# Patient Record
Sex: Female | Born: 1965 | Race: White | Hispanic: No | Smoking: Former smoker
Health system: Southern US, Community
[De-identification: ages and names within clinical notes are randomized; demographics above are authoritative.]

## PROBLEM LIST (undated history)

## (undated) ENCOUNTER — Ambulatory Visit: Admission: EM | Payer: Medicaid Other | Source: Home / Self Care

## (undated) DIAGNOSIS — E785 Hyperlipidemia, unspecified: Secondary | ICD-10-CM

## (undated) DIAGNOSIS — I10 Essential (primary) hypertension: Secondary | ICD-10-CM

## (undated) DIAGNOSIS — T7840XA Allergy, unspecified, initial encounter: Secondary | ICD-10-CM

## (undated) HISTORY — DX: Allergy, unspecified, initial encounter: T78.40XA

## (undated) HISTORY — DX: Essential (primary) hypertension: I10

## (undated) HISTORY — DX: Hyperlipidemia, unspecified: E78.5

---

## 1996-03-27 DIAGNOSIS — Z87891 Personal history of nicotine dependence: Secondary | ICD-10-CM | POA: Insufficient documentation

## 1996-03-27 DIAGNOSIS — J309 Allergic rhinitis, unspecified: Secondary | ICD-10-CM | POA: Insufficient documentation

## 1997-12-02 DIAGNOSIS — Z8669 Personal history of other diseases of the nervous system and sense organs: Secondary | ICD-10-CM | POA: Insufficient documentation

## 1998-03-07 HISTORY — PX: VAGINAL HYSTERECTOMY: SUR661

## 2003-10-25 ENCOUNTER — Ambulatory Visit: Payer: Self-pay | Admitting: Specialist

## 2005-07-10 ENCOUNTER — Ambulatory Visit: Payer: Self-pay

## 2007-07-08 DIAGNOSIS — E7800 Pure hypercholesterolemia, unspecified: Secondary | ICD-10-CM | POA: Insufficient documentation

## 2007-07-08 DIAGNOSIS — L989 Disorder of the skin and subcutaneous tissue, unspecified: Secondary | ICD-10-CM | POA: Insufficient documentation

## 2007-07-10 ENCOUNTER — Ambulatory Visit: Payer: Self-pay

## 2007-08-11 ENCOUNTER — Ambulatory Visit: Payer: Self-pay | Admitting: Gastroenterology

## 2007-08-11 LAB — HM COLONOSCOPY

## 2008-01-27 DIAGNOSIS — M779 Enthesopathy, unspecified: Secondary | ICD-10-CM | POA: Insufficient documentation

## 2009-03-15 ENCOUNTER — Ambulatory Visit: Payer: Self-pay

## 2009-06-21 DIAGNOSIS — M791 Myalgia, unspecified site: Secondary | ICD-10-CM | POA: Insufficient documentation

## 2011-04-09 ENCOUNTER — Ambulatory Visit: Payer: Self-pay | Admitting: Family Medicine

## 2012-03-28 ENCOUNTER — Emergency Department: Payer: Self-pay | Admitting: Emergency Medicine

## 2012-03-28 HISTORY — PX: FRACTURE SURGERY: SHX138

## 2013-02-24 ENCOUNTER — Ambulatory Visit: Payer: Self-pay | Admitting: Family Medicine

## 2014-03-13 ENCOUNTER — Emergency Department: Payer: Self-pay | Admitting: Emergency Medicine

## 2014-04-13 ENCOUNTER — Ambulatory Visit: Payer: Self-pay | Admitting: Family Medicine

## 2014-04-13 LAB — HM MAMMOGRAPHY

## 2014-08-03 ENCOUNTER — Other Ambulatory Visit: Payer: Self-pay

## 2014-08-03 DIAGNOSIS — G4700 Insomnia, unspecified: Secondary | ICD-10-CM

## 2014-08-03 MED ORDER — QUETIAPINE FUMARATE 25 MG PO TABS: 25.0000 mg | ORAL_TABLET | Freq: Every day | ORAL | Status: DC

## 2014-08-23 HISTORY — PX: KNEE ARTHROSCOPY: SUR90

## 2014-10-13 ENCOUNTER — Other Ambulatory Visit: Payer: Self-pay | Admitting: Family Medicine

## 2014-10-13 DIAGNOSIS — E7800 Pure hypercholesterolemia, unspecified: Secondary | ICD-10-CM

## 2014-10-13 NOTE — Telephone Encounter (Signed)
Last OV 03/2014  Thanks,   -Laura  

## 2014-11-15 ENCOUNTER — Other Ambulatory Visit: Payer: Self-pay | Admitting: Family Medicine

## 2014-11-15 DIAGNOSIS — G43919 Migraine, unspecified, intractable, without status migrainosus: Secondary | ICD-10-CM

## 2014-11-15 NOTE — Telephone Encounter (Signed)
LOV 04/06/2014. Allene DillonEmily Drozdowski, CMA

## 2015-01-14 ENCOUNTER — Other Ambulatory Visit: Payer: Self-pay

## 2015-01-14 DIAGNOSIS — I10 Essential (primary) hypertension: Secondary | ICD-10-CM | POA: Insufficient documentation

## 2015-01-14 MED ORDER — LISINOPRIL 10 MG PO TABS: 10.0000 mg | ORAL_TABLET | Freq: Every day | ORAL | Status: DC

## 2015-02-04 DIAGNOSIS — R7303 Prediabetes: Secondary | ICD-10-CM | POA: Insufficient documentation

## 2015-02-04 DIAGNOSIS — R42 Dizziness and giddiness: Secondary | ICD-10-CM | POA: Insufficient documentation

## 2015-02-04 DIAGNOSIS — L309 Dermatitis, unspecified: Secondary | ICD-10-CM | POA: Insufficient documentation

## 2015-02-04 DIAGNOSIS — E559 Vitamin D deficiency, unspecified: Secondary | ICD-10-CM | POA: Insufficient documentation

## 2015-02-04 DIAGNOSIS — G4700 Insomnia, unspecified: Secondary | ICD-10-CM | POA: Insufficient documentation

## 2015-02-07 ENCOUNTER — Encounter: Payer: Self-pay | Admitting: Family Medicine

## 2015-02-07 ENCOUNTER — Ambulatory Visit (INDEPENDENT_AMBULATORY_CARE_PROVIDER_SITE_OTHER): Payer: BLUE CROSS/BLUE SHIELD | Admitting: Family Medicine

## 2015-02-07 VITALS — BP 102/64 | HR 98 | Temp 98.4°F | Resp 16 | Ht 63.5 in | Wt 177.0 lb

## 2015-02-07 DIAGNOSIS — G4700 Insomnia, unspecified: Secondary | ICD-10-CM

## 2015-02-07 DIAGNOSIS — E781 Pure hyperglyceridemia: Secondary | ICD-10-CM | POA: Diagnosis not present

## 2015-02-07 DIAGNOSIS — Z23 Encounter for immunization: Secondary | ICD-10-CM | POA: Diagnosis not present

## 2015-02-07 DIAGNOSIS — E559 Vitamin D deficiency, unspecified: Secondary | ICD-10-CM | POA: Diagnosis not present

## 2015-02-07 DIAGNOSIS — E7800 Pure hypercholesterolemia, unspecified: Secondary | ICD-10-CM

## 2015-02-07 DIAGNOSIS — J309 Allergic rhinitis, unspecified: Secondary | ICD-10-CM

## 2015-02-07 DIAGNOSIS — G43919 Migraine, unspecified, intractable, without status migrainosus: Secondary | ICD-10-CM | POA: Diagnosis not present

## 2015-02-07 DIAGNOSIS — I10 Essential (primary) hypertension: Secondary | ICD-10-CM | POA: Diagnosis not present

## 2015-02-07 MED ORDER — LEVETIRACETAM 750 MG PO TABS: 750.0000 mg | ORAL_TABLET | Freq: Every day | ORAL | Status: DC

## 2015-02-07 MED ORDER — PROPRANOLOL HCL 40 MG PO TABS: 40.0000 mg | ORAL_TABLET | Freq: Two times a day (BID) | ORAL | Status: DC

## 2015-02-07 MED ORDER — FLUTICASONE PROPIONATE 50 MCG/ACT NA SUSP: 2.0000 | Freq: Every day | NASAL | Status: DC

## 2015-02-07 MED ORDER — PRAVASTATIN SODIUM 40 MG PO TABS: 40.0000 mg | ORAL_TABLET | Freq: Every day | ORAL | Status: DC

## 2015-02-07 MED ORDER — QUETIAPINE FUMARATE 25 MG PO TABS: 25.0000 mg | ORAL_TABLET | Freq: Every day | ORAL | Status: DC

## 2015-02-07 MED ORDER — LISINOPRIL 10 MG PO TABS: 10.0000 mg | ORAL_TABLET | Freq: Every day | ORAL | Status: DC

## 2015-02-07 NOTE — Progress Notes (Signed)
Patient ID: Angela Woodward, female   DOB: 02/08/1965, 50 y.o.   MRN: [id number]         [name] Female    DOB: 12/25/1965   50 y.o.   MRN: [id number] Visit Date: 02/07/2015  Today's Provider: Lorie PhenixNancy Tinika Bucknam, MD   Chief Complaint  Patient presents with  . Hypertension  . Hyperlipidemia  . Migraine   Subjective:    Hypertension This is a chronic problem. The problem is controlled (Pt reports her blood pressure has been running low recently.  This morning it was 97/63). Associated symptoms include malaise/fatigue. Pertinent negatives include no anxiety, blurred vision, chest pain, headaches, neck pain, orthopnea, palpitations, peripheral edema, PND, shortness of breath or sweats. Risk factors for coronary artery disease include dyslipidemia. There are no compliance problems.   Hyperlipidemia This is a chronic problem. The problem is controlled. Recent lipid tests were reviewed and are normal. Pertinent negatives include no chest pain or shortness of breath. Current antihyperlipidemic treatment includes statins. There are no compliance problems.   Migraine  This is a chronic problem. The problem has been resolved (Under good control with Keppra.  Pt reports not having a mirgrain in several years.). The patient is experiencing no pain. Associated symptoms include insomnia. Pertinent negatives include no blurred vision, dizziness, fever, neck pain or weakness. The treatment provided significant relief. Her past medical history is significant for hypertension.  Insomnia Primary symptoms: malaise/fatigue.  The problem has been resolved since onset. How many beverages per day that contain caffeine: 2-3.  Typical bedtime:  8-10 P.M..  How long after going to bed to you fall asleep: less than 15 minutes.          Allergies  Allergen Reactions  . Maxalt  [Rizatriptan Benzoate] Swelling    Throat swelling.  . Cephalosporins   . Codeine   . Erythromycin   . Ketorolac  Tromethamine   . Penicillins   . Simvastatin Swelling    Other reaction(s): Muscle Pain  . Sulfa Antibiotics   . Tetanus Toxoid     Other reaction(s): Swelling   Previous Medications   FLUTICASONE (FLONASE) 50 MCG/ACT NASAL SPRAY    Place 2 sprays into the nose daily.   LEVETIRACETAM (KEPPRA) 750 MG TABLET    TAKE ONE TABLET BY MOUTH AT BEDTIME   LISINOPRIL (PRINIVIL,ZESTRIL) 10 MG TABLET    Take 1 tablet (10 mg total) by mouth daily. Pt needs to schedule office visit before anymore refills.   LORATADINE (CLARITIN) 10 MG TABLET    Take 1 tablet by mouth daily.   MONTELUKAST (SINGULAIR) 10 MG TABLET    Take 1 tablet by mouth daily.   PRAVASTATIN (PRAVACHOL) 40 MG TABLET    TAKE ONE TABLET BY MOUTH AT BEDTIME   PROPRANOLOL (INDERAL) 80 MG TABLET    Take 1 tablet by mouth 2 (two) times daily.   QUETIAPINE (SEROQUEL) 25 MG TABLET    Take 1 tablet (25 mg total) by mouth at bedtime.    Review of Systems  Constitutional: Positive for malaise/fatigue and fatigue. Negative for fever, chills, diaphoresis, activity change, appetite change and unexpected weight change.  Eyes: Negative for blurred vision.  Respiratory: Negative.  Negative for shortness of breath.   Cardiovascular: Negative.  Negative for chest pain, palpitations, orthopnea and PND.  Gastrointestinal: Negative.   Musculoskeletal: Negative.  Negative for neck pain.  Neurological: Positive for light-headedness. Negative for dizziness, weakness and headaches.  Psychiatric/Behavioral: The patient has insomnia.  Social History  Substance Use Topics  . Smoking status: Former Smoker    Quit date: 09/09/2009  . Smokeless tobacco: Never Used  . Alcohol Use: No   Objective:   BP 102/64 mmHg  Pulse 98  Temp(Src) 98.4 F (36.9 C) (Oral)  Resp 16  Ht 5' 3.5" (1.613 m)  Wt 177 lb (80.287 kg)  BMI 30.86 kg/m2  Physical Exam  Constitutional: She is oriented to person, place, and time. She appears well-developed and  well-nourished.  Cardiovascular: Normal rate, regular rhythm and normal heart sounds.   Pulmonary/Chest: Effort normal and breath sounds normal.  Neurological: She is alert and oriented to person, place, and time.  Psychiatric: She has a normal mood and affect. Her behavior is normal. Judgment and thought content normal.        Assessment & Plan:      1. Essential hypertension Blood pressure low today, She states it has also been running low at home.  Will decrease Inderal to 40mg  twice a day.  Advised pt to call with blood pressure reading, may need to reduce meds more.  Advised her to call if her Migraines return.   - lisinopril (PRINIVIL,ZESTRIL) 10 MG tablet; Take 1 tablet (10 mg total) by mouth daily. Pt needs to schedule office visit before anymore refills.  Dispense: 90 tablet; Refill: 1 - propranolol (INDERAL) 40 MG tablet; Take 1 tablet (40 mg total) by mouth 2 (two) times daily.  Dispense: 180 tablet; Refill: 1 - Comprehensive metabolic panel - Lipid panel - TSH  2. Intractable migraine without status migrainosus, unspecified migraine type Under good control, continue Keppra.  Refills provided.  - levETIRAcetam (KEPPRA) 750 MG tablet; Take 1 tablet (750 mg total) by mouth at bedtime.  Dispense: 90 tablet; Refill: 1  3. Hypercholesteremia Stable, refills provided. Check labs.  - pravastatin (PRAVACHOL) 40 MG tablet; Take 1 tablet (40 mg total) by mouth at bedtime.  Dispense: 90 tablet; Refill: 1 - Lipid panel  4. Insomnia Stable, Refills provided.  - QUEtiapine (SEROQUEL) 25 MG tablet; Take 1 tablet (25 mg total) by mouth at bedtime.  Dispense: 90 tablet; Refill: 1  5. Allergic rhinitis, unspecified allergic rhinitis type Stable, Refills provided.  - fluticasone (FLONASE) 50 MCG/ACT nasal spray; Place 2 sprays into both nostrils daily.  Dispense: 16 g; Refill: 5 - CBC with Differential/Platelet  6. Avitaminosis D Has been a problem in the past, will recheck.  -  VITAMIN D 25 Hydroxy (Vit-D Deficiency, Fractures)  7. Hyperglyceridemia, pure Will check labs. - Hemoglobin A1c  8. Flu vaccine need Given in the office today. - Flu Vaccine QUAD 36+ mos PF IM (Fluarix & Fluzone Quad PF)       Lorie Phenix, MD  [facility]

## 2015-02-09 ENCOUNTER — Telehealth: Payer: Self-pay

## 2015-02-09 LAB — LIPID PANEL
Chol/HDL Ratio: 3.2 ratio units (ref 0.0–4.4)
Cholesterol, Total: 146 mg/dL (ref 100–199)
HDL: 46 mg/dL (ref >39)
LDL Calculated: 73 mg/dL (ref 0–99)
Triglycerides: 134 mg/dL (ref 0–149)
VLDL Cholesterol Cal: 27 mg/dL (ref 5–40)

## 2015-02-09 LAB — CBC WITH DIFFERENTIAL/PLATELET
Basophils Absolute: 0 10*3/uL (ref 0.0–0.2)
Basos: 0 %
EOS (ABSOLUTE): 0.4 10*3/uL (ref 0.0–0.4)
Eos: 6 %
Hematocrit: 40.8 % (ref 34.0–46.6)
Hemoglobin: 13.8 g/dL (ref 11.1–15.9)
Immature Grans (Abs): 0 10*3/uL (ref 0.0–0.1)
Immature Granulocytes: 0 %
Lymphocytes Absolute: 1.4 10*3/uL (ref 0.7–3.1)
Lymphs: 22 %
MCH: 30.5 pg (ref 26.6–33.0)
MCHC: 33.8 g/dL (ref 31.5–35.7)
MCV: 90 fL (ref 79–97)
Monocytes Absolute: 0.6 10*3/uL (ref 0.1–0.9)
Monocytes: 9 %
Neutrophils Absolute: 4.1 10*3/uL (ref 1.4–7.0)
Neutrophils: 63 %
Platelets: 306 10*3/uL (ref 150–379)
RBC: 4.52 x10E6/uL (ref 3.77–5.28)
RDW: 13.8 % (ref 12.3–15.4)
WBC: 6.5 10*3/uL (ref 3.4–10.8)

## 2015-02-09 LAB — COMPREHENSIVE METABOLIC PANEL
ALT: 16 IU/L (ref 0–32)
AST: 17 IU/L (ref 0–40)
Albumin/Globulin Ratio: 2.1 (ref 1.1–2.5)
Albumin: 4.5 g/dL (ref 3.5–5.5)
Alkaline Phosphatase: 89 IU/L (ref 39–117)
BUN/Creatinine Ratio: 13 (ref 9–23)
BUN: 10 mg/dL (ref 6–24)
Bilirubin Total: 0.6 mg/dL (ref 0.0–1.2)
CO2: 25 mmol/L (ref 18–29)
Calcium: 9.4 mg/dL (ref 8.7–10.2)
Chloride: 99 mmol/L (ref 96–106)
Creatinine, Ser: 0.77 mg/dL (ref 0.57–1.00)
GFR calc Af Amer: 104 mL/min/{1.73_m2} (ref >59)
GFR calc non Af Amer: 90 mL/min/{1.73_m2} (ref >59)
Globulin, Total: 2.1 g/dL (ref 1.5–4.5)
Glucose: 101 mg/dL — ABNORMAL HIGH (ref 65–99)
Potassium: 4.6 mmol/L (ref 3.5–5.2)
Sodium: 138 mmol/L (ref 134–144)
Total Protein: 6.6 g/dL (ref 6.0–8.5)

## 2015-02-09 LAB — HEMOGLOBIN A1C
Est. average glucose Bld gHb Est-mCnc: 111 mg/dL
Hgb A1c MFr Bld: 5.5 % (ref 4.8–5.6)

## 2015-02-09 LAB — TSH: TSH: 1.58 u[IU]/mL (ref 0.450–4.500)

## 2015-02-09 LAB — VITAMIN D 25 HYDROXY (VIT D DEFICIENCY, FRACTURES): Vit D, 25-Hydroxy: 16.1 ng/mL — ABNORMAL LOW (ref 30.0–100.0)

## 2015-02-09 NOTE — Telephone Encounter (Signed)
LMTCB 02/09/2015  Thanks,   -Laura 

## 2015-02-09 NOTE — Telephone Encounter (Signed)
-----   Message from Lorie Phenix, MD sent at 02/09/2015  9:54 AM EST ----- Labs stable except for low Vit D. Start vit D 2000 iu daily. Thanks.

## 2015-02-10 NOTE — Telephone Encounter (Signed)
Pt advised.   Thanks,   -Laura  

## 2015-07-28 ENCOUNTER — Other Ambulatory Visit: Payer: Self-pay | Admitting: Family Medicine

## 2015-07-28 NOTE — Telephone Encounter (Signed)
LOV  02/07/2015

## 2015-09-22 ENCOUNTER — Other Ambulatory Visit: Payer: Self-pay | Admitting: Family Medicine

## 2015-09-22 DIAGNOSIS — I10 Essential (primary) hypertension: Secondary | ICD-10-CM

## 2015-10-23 ENCOUNTER — Other Ambulatory Visit: Payer: Self-pay | Admitting: Family Medicine

## 2015-10-24 ENCOUNTER — Encounter: Payer: Self-pay | Admitting: Family Medicine

## 2015-10-24 ENCOUNTER — Ambulatory Visit (INDEPENDENT_AMBULATORY_CARE_PROVIDER_SITE_OTHER): Payer: BLUE CROSS/BLUE SHIELD | Admitting: Family Medicine

## 2015-10-24 VITALS — BP 122/70 | HR 66 | Temp 97.8°F | Resp 16 | Wt 172.0 lb

## 2015-10-24 DIAGNOSIS — M546 Pain in thoracic spine: Secondary | ICD-10-CM | POA: Diagnosis not present

## 2015-10-24 LAB — POCT URINALYSIS DIPSTICK
Bilirubin, UA: NEGATIVE
Blood, UA: NEGATIVE
Glucose, UA: NEGATIVE
Ketones, UA: NEGATIVE
Leukocytes, UA: NEGATIVE
Nitrite, UA: NEGATIVE
Protein, UA: NEGATIVE
Spec Grav, UA: 1.01
Urobilinogen, UA: 0.2
pH, UA: 6

## 2015-10-24 MED ORDER — CYCLOBENZAPRINE HCL 5 MG PO TABS: 5.0000 mg | ORAL_TABLET | Freq: Three times a day (TID) | ORAL | 1 refills | Status: DC | PRN

## 2015-10-24 NOTE — Patient Instructions (Addendum)
   You can 2 ibuprofen every eight hours until pain has gone   Call office if you develop any rashes or new skin lesions on your back

## 2015-10-24 NOTE — Progress Notes (Signed)
[name] Female    DOB: 08/17/1965   50 y.o.   MRN: [id number] Visit Date: 10/24/2015  Today's Provider: Mila Merryonald Fisher, MD   Chief Complaint  Patient presents with  . Back Pain   Subjective:    Back Pain  This is a new problem. Episode onset: 2-3 days ago. The problem occurs intermittently. The problem has been gradually worsening since onset. The quality of the pain is described as cramping (catching pain). Radiates to: left flank into the left inguinal area. The symptoms are aggravated by twisting. Associated symptoms include abdominal pain. Pertinent negatives include no bladder incontinence, bowel incontinence, chest pain, dysuria, fever, headaches, leg pain, numbness, paresis, paresthesias, pelvic pain, perianal numbness, tingling, weakness or weight loss. She has tried ice and NSAIDs for the symptoms. The treatment provided mild relief.       Allergies  Allergen Reactions  . Maxalt  [Rizatriptan Benzoate] Swelling    Throat swelling.  . Cephalosporins   . Codeine   . Erythromycin   . Ketorolac Tromethamine   . Penicillins   . Simvastatin Swelling    Other reaction(s): Muscle Pain  . Sulfa Antibiotics   . Tetanus Toxoid     Other reaction(s): Swelling     Current Outpatient Prescriptions:  .  levETIRAcetam (KEPPRA) 750 MG tablet, TAKE 1 TABLET BY MOUTH EVERY NIGHT AT BEDTIME, Disp: 90 tablet, Rfl: 0 .  lisinopril (PRINIVIL,ZESTRIL) 10 MG tablet, TAKE 1 TABLET BY MOUTH EVERY DAY, Disp: 90 tablet, Rfl: 3 .  pravastatin (PRAVACHOL) 40 MG tablet, TAKE 1 TABLET BY MOUTH EVERY NIGHT AT BEDTIME, Disp: 90 tablet, Rfl: 0 .  propranolol (INDERAL) 40 MG tablet, TAKE 1 TABLET BY MOUTH TWICE DAILY, Disp: 180 tablet, Rfl: 0 .  QUEtiapine (SEROQUEL) 25 MG tablet, TAKE 1 TABLET BY MOUTH EVERY NIGHT AT BEDTIME, Disp: 90 tablet, Rfl: 0 .  fluticasone (FLONASE) 50 MCG/ACT nasal spray, Place 2 sprays into both nostrils daily. (Patient not taking: Reported on  10/24/2015), Disp: 16 g, Rfl: 5 .  loratadine (CLARITIN) 10 MG tablet, Take 1 tablet by mouth daily., Disp: , Rfl:  .  montelukast (SINGULAIR) 10 MG tablet, Take 1 tablet by mouth daily., Disp: , Rfl:   Review of Systems  Constitutional: Negative for appetite change, chills, fatigue, fever and weight loss.  Respiratory: Negative for chest tightness and shortness of breath.   Cardiovascular: Negative for chest pain and palpitations.  Gastrointestinal: Positive for abdominal pain. Negative for bowel incontinence, nausea and vomiting.  Genitourinary: Negative for bladder incontinence, dysuria and pelvic pain.  Musculoskeletal: Positive for back pain and myalgias (muscle spasms in her back).  Neurological: Negative for dizziness, tingling, weakness, numbness, headaches and paresthesias.    Social History  Substance Use Topics  . Smoking status: Former Smoker    Quit date: 09/09/2009  . Smokeless tobacco: Never Used  . Alcohol use No   Objective:   BP 122/70 (BP Location: Right Arm, Patient Position: Sitting, Cuff Size: Normal)   Pulse 66   Temp 97.8 F (36.6 C) (Oral)   Resp 16   Wt 172 lb (78 kg)   BMI 29.99 kg/m   Physical Exam   General Appearance:    Alert, cooperative, no distress  Eyes:    PERRL, conjunctiva/corneas clear, EOM's intact       MS:   No spine tenderness. Point tenderness left posterior intercostal muscles about 4cm left of midline. No gross deformities.  No CVA  tenderness.   Skin:   Small solitary slightly raised follicular lesion right flank just inferior to area of point tenderness. No surrounding erythema.        Assessment & Plan:     1. Acute left-sided thoracic back pain Exam c/w musculoskeletal source, although there is single slightly vesicular appearing lesion near site of pain. Discussed possibility of Zoster with patient. She is going to call if she develops any rashes or other skin lesions. For now will start on scheduled doses of ibuprofen 400mg   every eight hours and continue application ice two to three times daily.  - POCT Urinalysis Dipstick - cyclobenzaprine (FLEXERIL) 5 MG tablet; Take 1 tablet (5 mg total) by mouth 3 (three) times daily as needed for muscle spasms.  Dispense: 20 tablet; Refill: 1          Mila Merry, MD  [facility]

## 2015-10-31 ENCOUNTER — Ambulatory Visit: Payer: BLUE CROSS/BLUE SHIELD | Admitting: Family Medicine

## 2015-10-31 ENCOUNTER — Emergency Department
Admission: EM | Admit: 2015-10-31 | Discharge: 2015-10-31 | Disposition: A | Discharge status: Home / Self Care | Payer: BLUE CROSS/BLUE SHIELD | Attending: Emergency Medicine | Admitting: Emergency Medicine

## 2015-10-31 ENCOUNTER — Emergency Department: Payer: BLUE CROSS/BLUE SHIELD

## 2015-10-31 DIAGNOSIS — I10 Essential (primary) hypertension: Secondary | ICD-10-CM | POA: Diagnosis not present

## 2015-10-31 DIAGNOSIS — Z87891 Personal history of nicotine dependence: Secondary | ICD-10-CM | POA: Insufficient documentation

## 2015-10-31 DIAGNOSIS — K529 Noninfective gastroenteritis and colitis, unspecified: Secondary | ICD-10-CM | POA: Diagnosis not present

## 2015-10-31 DIAGNOSIS — R1032 Left lower quadrant pain: Secondary | ICD-10-CM | POA: Diagnosis present

## 2015-10-31 DIAGNOSIS — Z79899 Other long term (current) drug therapy: Secondary | ICD-10-CM | POA: Diagnosis not present

## 2015-10-31 LAB — COMPREHENSIVE METABOLIC PANEL WITH GFR
ALT: 22 U/L (ref 14–54)
AST: 24 U/L (ref 15–41)
Albumin: 4.3 g/dL (ref 3.5–5.0)
Alkaline Phosphatase: 86 U/L (ref 38–126)
Anion gap: 5 (ref 5–15)
BUN: 17 mg/dL (ref 6–20)
CO2: 27 mmol/L (ref 22–32)
Calcium: 8.8 mg/dL — ABNORMAL LOW (ref 8.9–10.3)
Chloride: 106 mmol/L (ref 101–111)
Creatinine, Ser: 0.88 mg/dL (ref 0.44–1.00)
GFR calc Af Amer: >60 mL/min
GFR calc non Af Amer: >60 mL/min
Glucose, Bld: 94 mg/dL (ref 65–99)
Potassium: 3.4 mmol/L — ABNORMAL LOW (ref 3.5–5.1)
Sodium: 138 mmol/L (ref 135–145)
Total Bilirubin: 0.7 mg/dL (ref 0.3–1.2)
Total Protein: 7.1 g/dL (ref 6.5–8.1)

## 2015-10-31 LAB — TYPE AND SCREEN
ABO/RH(D): O POS
Antibody Screen: NEGATIVE

## 2015-10-31 LAB — CBC
HCT: 44.3 % (ref 35.0–47.0)
Hemoglobin: 15 g/dL (ref 12.0–16.0)
MCH: 29.9 pg (ref 26.0–34.0)
MCHC: 33.8 g/dL (ref 32.0–36.0)
MCV: 88.3 fL (ref 80.0–100.0)
Platelets: 276 10*3/uL (ref 150–440)
RBC: 5.02 MIL/uL (ref 3.80–5.20)
RDW: 13.1 % (ref 11.5–14.5)
WBC: 8.9 10*3/uL (ref 3.6–11.0)

## 2015-10-31 MED ORDER — METRONIDAZOLE 500 MG PO TABS: 500.0000 mg | ORAL_TABLET | Freq: Three times a day (TID) | ORAL | 0 refills | Status: AC

## 2015-10-31 MED ORDER — IOPAMIDOL (ISOVUE-300) INJECTION 61%
100.0000 mL | Freq: Once | INTRAVENOUS | Status: AC | PRN
  Administered 2015-10-31: 100 mL via INTRAVENOUS

## 2015-10-31 MED ORDER — IOPAMIDOL (ISOVUE-300) INJECTION 61%
30.0000 mL | Freq: Once | INTRAVENOUS | Status: AC | PRN
  Administered 2015-10-31: 30 mL via ORAL

## 2015-10-31 MED ORDER — CIPROFLOXACIN HCL 500 MG PO TABS
500.0000 mg | ORAL_TABLET | Freq: Once | ORAL | Status: AC
  Administered 2015-10-31: 500 mg via ORAL
  Filled 2015-10-31: qty 1

## 2015-10-31 MED ORDER — CIPROFLOXACIN HCL 500 MG PO TABS: 500.0000 mg | ORAL_TABLET | Freq: Two times a day (BID) | ORAL | 0 refills | Status: DC

## 2015-10-31 MED ORDER — METRONIDAZOLE 500 MG PO TABS
500.0000 mg | ORAL_TABLET | Freq: Once | ORAL | Status: AC
  Administered 2015-10-31: 500 mg via ORAL
  Filled 2015-10-31: qty 1

## 2015-10-31 MED ORDER — TRAMADOL HCL 50 MG PO TABS: 50.0000 mg | ORAL_TABLET | Freq: Four times a day (QID) | ORAL | 0 refills | Status: DC | PRN

## 2015-10-31 NOTE — ED Triage Notes (Signed)
Bright red to red blood in stool, each time with BM. Lower abdominal pain. Cramping X 3 days. Pt alert and oriented X4, active, cooperative, pt in NAD. RR even and unlabored, color WNL.

## 2015-10-31 NOTE — Discharge Instructions (Signed)
Please take your antibiotics as prescribed for their entire course even if feeling better. Please follow-up with her primary care doctor in the next 1-2 days for recheck/reevaluation. Please take your pain medication as needed, as prescribed, you may also use ibuprofen or Tylenol as written on the box. Return to the emergency department for any significant increase in rectal bleeding, increase in pain, or any other symptom personally concerning to yourself.

## 2015-10-31 NOTE — ED Provider Notes (Addendum)
[facility] Emergency Department Provider Note  Time seen: 4:19 PM  I have reviewed the triage vital signs and the nursing notes.   HISTORY  Chief Complaint Rectal Bleeding    HPI Angela Woodward is a 50 y.o. female With a past medical history of hypertension and hyperlipidemia who presents the emergency department with rectal bleeding left lower quadrant abdominal discomfort. According to the patient for the past one week she has been having left lower quadrant abdominal pain. She has noticed blood in her stool over the past 3 days. States the pain is worse today so she came to the emergency department for evaluation.describes the pain as moderate, dull aching pain located in the left lower quadrant. Denies any fever. States she saw her primary care doctor for the same initially thought she might have urinary tract infection but the urinalysis was normal. Thought she might of pulled a muscle, but at that time she was not having rectal bleeding.  Past Medical History:  Diagnosis Date  . Allergy   . Hyperlipidemia   . Hypertension     Patient Active Problem List   Diagnosis Date Noted  . Dermatitis, eczematoid 02/04/2015  . Dizziness 02/04/2015  . Borderline diabetes 02/04/2015  . 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 Avitaminosis D 02/04/2015  . BP (high blood pressure) 01/14/2015  . Insomnia 08/03/2014  . Muscle ache 06/21/2009  . Enthesopathy 01/27/2008  . Dermatologic disease 07/08/2007  . Hypercholesterolemia without hypertriglyceridemia 07/08/2007  . Headache, migraine, intractable 12/02/1997  . Allergic rhinitis 03/27/1996  . 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 FRACTURE SURGERY  03/28/12   wrist 4 pins, 10 screws   . KNEE ARTHROSCOPY Left 08/2014   Dr. Thomasena Edisollins; [facility]  . VAGINAL HYSTERECTOMY  03/07/1998    Prior to Admission medications   Medication Sig Start Date End Date  Taking? Authorizing Provider  cyclobenzaprine (FLEXERIL) 5 MG tablet Take 1 tablet (5 mg total) by mouth 3 (three) times daily as needed for muscle spasms. 10/24/15   Malva Limesonald E Fisher, MD  fluticasone (FLONASE) 50 MCG/ACT nasal spray Place 2 sprays into both nostrils daily. Patient not taking: Reported on 10/24/2015 02/07/15   Lorie PhenixNancy Maloney, MD  levETIRAcetam (KEPPRA) 750 MG tablet TAKE 1 TABLET BY MOUTH EVERY NIGHT AT BEDTIME 10/26/15   Malva Limesonald E Fisher, MD  lisinopril (PRINIVIL,ZESTRIL) 10 MG tablet TAKE 1 TABLET BY MOUTH EVERY DAY 09/23/15   Malva Limesonald E Fisher, MD  loratadine (CLARITIN) 10 MG tablet Take 1 tablet by mouth daily. 09/29/13   Historical Provider, MD  montelukast (SINGULAIR) 10 MG tablet Take 1 tablet by mouth daily. 09/29/13   Historical Provider, MD  pravastatin (PRAVACHOL) 40 MG tablet TAKE 1 TABLET BY MOUTH EVERY NIGHT AT BEDTIME 10/26/15   Malva Limesonald E Fisher, MD  propranolol (INDERAL) 40 MG tablet TAKE 1 TABLET BY MOUTH TWICE DAILY 10/26/15   Malva Limesonald E Fisher, MD  QUEtiapine (SEROQUEL) 25 MG tablet TAKE 1 TABLET BY MOUTH EVERY NIGHT AT BEDTIME 10/26/15   Malva Limesonald E Fisher, MD    Allergies  Allergen Reactions  . Maxalt  [Rizatriptan Benzoate] Swelling    Throat swelling.  . Cephalosporins   . Codeine   . Erythromycin   . Ketorolac Tromethamine   . Penicillins   . Simvastatin Swelling    Other reaction(s): Muscle Pain  . Sulfa Antibiotics   . Tetanus Toxoid     Other reaction(s): Swelling  Family History  Problem Relation Age of Onset  . Cancer Father   . Lung cancer Father   . Hypertension Father   . Mental illness Father   . Obesity Brother   . Hypertension Brother   . Diabetes Brother   . Hypertension Brother   . Obesity Brother   . Stroke Brother Early 30's    paralyses of left side of body  . Heart attack Brother 32  . Leukemia Mother     Social History Social History  Substance Use Topics  . Smoking status: Former Smoker    Quit date: 09/09/2009  . Smokeless  tobacco: Never Used  . Alcohol use No    Review of Systems Constitutional: Negative for fever. Cardiovascular: Negative for chest pain. Respiratory: Negative for shortness of breath. Gastrointestinal: Left lower quadrant abdominal pain. Negative for nausea, vomiting. Does state loose stool with bright red blood. Genitourinary: Negative for dysuria. Musculoskeletal: Negative for back pain Neurological: Negative for headache 10-point ROS otherwise negative.  ____________________________________________   PHYSICAL EXAM:  VITAL SIGNS: ED Triage Vitals  Enc Vitals Group     BP 10/31/15 1259 138/89     Pulse Rate 10/31/15 1259 78     Resp 10/31/15 1259 18     Temp 10/31/15 1259 98.1 F (36.7 C)     Temp Source 10/31/15 1259 Oral     SpO2 10/31/15 1259 100 %     Weight 10/31/15 1259 170 lb (77.1 kg)     Height 10/31/15 1259 5\' 3"  (1.6 m)     Head Circumference --      Peak Flow --      Pain Score 10/31/15 1303 5     Pain Loc --      Pain Edu? --      Excl. in GC? --     Constitutional: Alert and oriented. Well appearing and in no distress. Eyes: Normal exam ENT   Head: Normocephalic and atraumatic.   Mouth/Throat: Mucous membranes are moist. Cardiovascular: Normal rate, regular rhythm. No murmur Respiratory: Normal respiratory effort without tachypnea nor retractions. Breath sounds are clear Gastrointestinal: Soft, moderate left lower quadrant abdominal tenderness to palpation without rebound or guarding. No distention. Abdomen otherwise benign. Musculoskeletal: Nontender with normal range of motion in all extremities.  Neurologic:  Normal speech and language. No gross focal neurologic deficits Skin:  Skin is warm, dry and intact.  Psychiatric: Mood and affect are normal.   ____________________________________________     RADIOLOGY  CT consistent with left-sided colitis  ____________________________________________   INITIAL IMPRESSION / ASSESSMENT AND  PLAN / ED COURSE  Pertinent labs & imaging results that were available during my care of the patient were reviewed by me and considered in my medical decision making (see chart for details).  The patient presents the emergency department left lower quadrant abdominal pain or rectal bleeding. Left lower quadrant abdominal pain has been ongoing for greater than 1 week, rectal bleeding for the past 2-3 days per patient. Somewhat worse today. Patient doesn't moderate tenderness to palpation in the left lower quadrant. We'll perform a rectal examination. Labs are within normal limits. We'll proceed with the CT abdomen/pelvis to further evaluate. Patient denies any history of colitis or diverticulitis in the past.  CT consistent with left-sided colitis. Unable to obtain a urine sample, the patient states she really wishes to go home at this time. We'll place the patient on antibiotics have her follow-up with her primary care doctor. The patient is agreeable  to this plan.  ____________________________________________   FINAL CLINICAL IMPRESSION(S) / ED DIAGNOSES  GI bleed Left lower quadrant abdominal pain Colitis   Minna Antis, MD 10/31/15 1931    Minna Antis, MD 10/31/15 1931

## 2015-10-31 NOTE — ED Notes (Addendum)
Pt reports she has blood in stool for the last 3 days - 3days ago the blood in stool was bright red but today it has been maroon in color - denies dizziness/weakness - she states that it started with lower back pain and lower abd spasms - pt has had a hysterectomy but describes the pain as "starting period"  - pt went to the MD on 2nd and they told her that it was a pulled muscle or shingles

## 2015-10-31 NOTE — ED Notes (Signed)
Contacted lab to see if urine had been tested - they stated they never received it - informed Dr Fredrich RomansPaduchowki and he stated to d/c urinalysis

## 2015-11-03 ENCOUNTER — Encounter: Payer: Self-pay | Admitting: Family Medicine

## 2015-11-03 ENCOUNTER — Ambulatory Visit (INDEPENDENT_AMBULATORY_CARE_PROVIDER_SITE_OTHER): Payer: BLUE CROSS/BLUE SHIELD | Admitting: Family Medicine

## 2015-11-03 VITALS — BP 110/68 | HR 68 | Temp 98.1°F | Resp 16 | Wt 172.0 lb

## 2015-11-03 DIAGNOSIS — K50111 Crohn's disease of large intestine with rectal bleeding: Secondary | ICD-10-CM | POA: Diagnosis not present

## 2015-11-03 DIAGNOSIS — K5010 Crohn's disease of large intestine without complications: Secondary | ICD-10-CM | POA: Insufficient documentation

## 2015-11-03 HISTORY — DX: Crohn's disease of large intestine with rectal bleeding: K50.111

## 2015-11-03 NOTE — Progress Notes (Signed)
[name] Female    DOB: 04/14/1965   50 y.o.   MRN: [id number] Visit Date: 11/03/2015  Today's Provider: Mila Merry, MD   Chief Complaint  Patient presents with  . Hospitalization Follow-up   Subjective:    HPI   Follow up ER visit  Patient was seen in ER for GI bleed, lower quadrant abdominal pain on 10/31/2015. She was treated for blood per rectum. Treatment for this included; CT obtained, consistent with segmental colitis. She was prescribed metronidazole and cipro which she has been taking consistently.  She reports good compliance with treatment. She reports this condition is Improved. Pain has nearly resolved. Is having no diarrhea. Tolerating medications well. Has had no more blood in stool.  ----------------------------------------------------------------    Allergies  Allergen Reactions  . Maxalt  [Rizatriptan Benzoate] Swelling    Throat swelling.  . Cephalosporins   . Codeine   . Erythromycin   . Ketorolac Tromethamine   . Penicillins   . Simvastatin Swelling    Other reaction(s): Muscle Pain  . Sulfa Antibiotics   . Tetanus Toxoid     Other reaction(s): Swelling     Current Outpatient Prescriptions:  .  ciprofloxacin (CIPRO) 500 MG tablet, Take 1 tablet (500 mg total) by mouth 2 (two) times daily., Disp: 28 tablet, Rfl: 0 .  cyclobenzaprine (FLEXERIL) 5 MG tablet, Take 1 tablet (5 mg total) by mouth 3 (three) times daily as needed for muscle spasms., Disp: 20 tablet, Rfl: 1 .  levETIRAcetam (KEPPRA) 750 MG tablet, TAKE 1 TABLET BY MOUTH EVERY NIGHT AT BEDTIME, Disp: 90 tablet, Rfl: 1 .  lisinopril (PRINIVIL,ZESTRIL) 10 MG tablet, TAKE 1 TABLET BY MOUTH EVERY DAY, Disp: 90 tablet, Rfl: 3 .  loratadine (CLARITIN) 10 MG tablet, Take 1 tablet by mouth daily., Disp: , Rfl:  .  metroNIDAZOLE (FLAGYL) 500 MG tablet, Take 1 tablet (500 mg total) by mouth 3 (three) times daily., Disp: 42 tablet, Rfl: 0 .  pravastatin (PRAVACHOL) 40 MG  tablet, TAKE 1 TABLET BY MOUTH EVERY NIGHT AT BEDTIME, Disp: 90 tablet, Rfl: 4 .  propranolol (INDERAL) 40 MG tablet, TAKE 1 TABLET BY MOUTH TWICE DAILY, Disp: 180 tablet, Rfl: 3 .  QUEtiapine (SEROQUEL) 25 MG tablet, TAKE 1 TABLET BY MOUTH EVERY NIGHT AT BEDTIME, Disp: 90 tablet, Rfl: 1  Review of Systems  Constitutional: Negative for appetite change, chills, fatigue and fever.  Respiratory: Negative for chest tightness and shortness of breath.   Cardiovascular: Negative for chest pain and palpitations.  Gastrointestinal: Negative for abdominal pain, nausea and vomiting.  Musculoskeletal: Positive for back pain.  Neurological: Negative for dizziness and weakness.    Social History  Substance Use Topics  . Smoking status: Former Smoker    Quit date: 09/09/2009  . Smokeless tobacco: Never Used  . Alcohol use No   Objective:   BP 110/68 (BP Location: Left Arm, Patient Position: Sitting, Cuff Size: Normal)   Pulse 68   Temp 98.1 F (36.7 C) (Oral)   Resp 16   Wt 172 lb (78 kg)   SpO2 97%   BMI 30.47 kg/m   Physical Exam  General Appearance:    Alert, cooperative, no distress  Eyes:    PERRL, conjunctiva/corneas clear, EOM's intact       Lungs:     Clear to auscultation bilaterally, respirations unlabored  Heart:    Regular rate and rhythm  Abdomen:   bowel sounds present and normal  in all 4 quadrants, soft, round, nontender or nondistended. No CVA tenderness        Assessment & Plan:     1. Segmental colitis with rectal bleeding (HCC) Symptomatically resolved with cipro and flagyl. Counseled to drink plenty of fluids. Consider fiber supplement. No further evaluation at this point. If symptoms recur will refer for colonoscopy. Las was in 2009.        Mila Merryonald Fisher, MD  [facility]

## 2015-11-03 NOTE — Patient Instructions (Signed)
Diverticulosis Diverticulosis is the condition that develops when small pouches (diverticula) form in the wall of your colon. Your colon, or large intestine, is where water is absorbed and stool is formed. The pouches form when the inside layer of your colon pushes through weak spots in the outer layers of your colon. CAUSES  No one knows exactly what causes diverticulosis. RISK FACTORS  Being older than 50. Your risk for this condition increases with age. Diverticulosis is rare in people younger than 40 years. By age 80, almost everyone has it.  Eating a low-fiber diet.  Being frequently constipated.  Being overweight.  Not getting enough exercise.  Smoking.  Taking over-the-counter pain medicines, like aspirin and ibuprofen. SYMPTOMS  Most people with diverticulosis do not have symptoms. DIAGNOSIS  Because diverticulosis often has no symptoms, health care providers often discover the condition during an exam for other colon problems. In many cases, a health care provider will diagnose diverticulosis while using a flexible scope to examine the colon (colonoscopy). TREATMENT  If you have never developed an infection related to diverticulosis, you may not need treatment. If you have had an infection before, treatment may include:  Eating more fruits, vegetables, and grains.  Taking a fiber supplement.  Taking a live bacteria supplement (probiotic).  Taking medicine to relax your colon. HOME CARE INSTRUCTIONS   Drink at least 6-8 glasses of water each day to prevent constipation.  Try not to strain when you have a bowel movement.  Keep all follow-up appointments. If you have had an infection before:  Increase the fiber in your diet as directed by your health care provider or dietitian.  Take a dietary fiber supplement if your health care provider approves.  Only take medicines as directed by your health care provider. SEEK MEDICAL CARE IF:   You have abdominal  pain.  You have bloating.  You have cramps.  You have not gone to the bathroom in 3 days. SEEK IMMEDIATE MEDICAL CARE IF:   Your pain gets worse.  Yourbloating becomes very bad.  You have a fever or chills, and your symptoms suddenly get worse.  You begin vomiting.  You have bowel movements that are bloody or black. MAKE SURE YOU:  Understand these instructions.  Will watch your condition.  Will get help right away if you are not doing well or get worse.   This information is not intended to replace advice given to you by your health care provider. Make sure you discuss any questions you have with your health care provider.   Document Released: 10/06/2003 Document Revised: 01/13/2013 Document Reviewed: 12/03/2012 Elsevier Interactive Patient Education 2016 [employer]  

## 2015-11-08 ENCOUNTER — Ambulatory Visit (INDEPENDENT_AMBULATORY_CARE_PROVIDER_SITE_OTHER): Payer: BLUE CROSS/BLUE SHIELD | Admitting: Family Medicine

## 2015-11-08 ENCOUNTER — Telehealth: Payer: Self-pay

## 2015-11-08 ENCOUNTER — Encounter: Payer: Self-pay | Admitting: Family Medicine

## 2015-11-08 VITALS — BP 106/72 | HR 69 | Temp 97.6°F | Resp 16 | Wt 172.0 lb

## 2015-11-08 DIAGNOSIS — T7840XA Allergy, unspecified, initial encounter: Secondary | ICD-10-CM

## 2015-11-08 MED ORDER — PREDNISONE 10 MG PO TABS: ORAL_TABLET | ORAL | 0 refills | Status: AC

## 2015-11-08 NOTE — Telephone Encounter (Signed)
Patient called stating that she has been taking Cipro and Flagyl for the past week. Last night she was itchy all over, and this morning woke up with a red bumpy rash in her mouth, arms, legs, chest and back. Patient denies any trouble breathing or swelling of the throat. Patient states she cant take Benadryl because it causes tightness in her throat and she breaks out in a rash. Patient has an appointment scheduled at 11am for evaluation. Patient was advised to go straight to the ER is she develops any trouble breathing, swelling or tightness in her throat. Patient verbally voiced understanding.

## 2015-11-08 NOTE — Progress Notes (Signed)
[name] Female    DOB: 02/25/1965   50 y.o.   MRN: [id number] Visit Date: 11/08/2015  Today's Provider: Mila Merryonald Fisher, MD   Chief Complaint  Patient presents with  . Rash   Subjective:    Rash  This is a new problem. The current episode started today. The problem has been gradually worsening since onset. The affected locations include the scalp, head, face, neck, chest, back, left arm, right arm, left buttock, right buttock and torso. The rash is characterized by redness and itchiness (bumpy). She was exposed to a new medication (was started on Flagyl and Cipro 1 week ago). Associated symptoms include anorexia, diarrhea, eye pain and fatigue. Pertinent negatives include no congestion, cough, facial edema, fever, joint pain, rhinorrhea, shortness of breath, sore throat or vomiting. Past treatments include nothing.  she states she had similar, but much more severe reaction to penicillin in the past. States she took "Benadryl for that reaction, but swelling got worse, so she does not want to take it. She denies any unusual food, specifically denies any type of fish, seafood, or peanuts. No new chemical or pet exposures. No new rx or OTC medications aside from cipro and flagyl.      Allergies  Allergen Reactions  . Maxalt  [Rizatriptan Benzoate] Swelling    Throat swelling.  . Cephalosporins   . Codeine   . Erythromycin   . Ketorolac Tromethamine   . Penicillins   . Simvastatin Swelling    Other reaction(s): Muscle Pain  . Sulfa Antibiotics   . Tetanus Toxoid     Other reaction(s): Swelling     Current Outpatient Prescriptions:  .  cyclobenzaprine (FLEXERIL) 5 MG tablet, Take 1 tablet (5 mg total) by mouth 3 (three) times daily as needed for muscle spasms., Disp: 20 tablet, Rfl: 1 .  levETIRAcetam (KEPPRA) 750 MG tablet, TAKE 1 TABLET BY MOUTH EVERY NIGHT AT BEDTIME, Disp: 90 tablet, Rfl: 1 .  lisinopril (PRINIVIL,ZESTRIL) 10 MG tablet, TAKE 1 TABLET BY  MOUTH EVERY DAY, Disp: 90 tablet, Rfl: 3 .  loratadine (CLARITIN) 10 MG tablet, Take 1 tablet by mouth daily., Disp: , Rfl:  .  pravastatin (PRAVACHOL) 40 MG tablet, TAKE 1 TABLET BY MOUTH EVERY NIGHT AT BEDTIME, Disp: 90 tablet, Rfl: 4 .  propranolol (INDERAL) 40 MG tablet, TAKE 1 TABLET BY MOUTH TWICE DAILY, Disp: 180 tablet, Rfl: 3 .  QUEtiapine (SEROQUEL) 25 MG tablet, TAKE 1 TABLET BY MOUTH EVERY NIGHT AT BEDTIME, Disp: 90 tablet, Rfl: 1 .  ciprofloxacin (CIPRO) 500 MG tablet, Take 1 tablet (500 mg total) by mouth 2 (two) times daily. (Patient not taking: Reported on 11/08/2015), Disp: 28 tablet, Rfl: 0 .  metroNIDAZOLE (FLAGYL) 500 MG tablet, Take 1 tablet (500 mg total) by mouth 3 (three) times daily. (Patient not taking: Reported on 11/08/2015), Disp: 42 tablet, Rfl: 0  Review of Systems  Constitutional: Positive for fatigue. Negative for appetite change, chills and fever.  HENT: Negative for congestion, rhinorrhea and sore throat.   Eyes: Positive for pain.  Respiratory: Negative for cough, chest tightness and shortness of breath.   Cardiovascular: Negative for chest pain and palpitations.  Gastrointestinal: Positive for anorexia and diarrhea. Negative for abdominal pain, nausea and vomiting.  Musculoskeletal: Negative for joint pain.  Skin: Positive for rash.  Neurological: Negative for dizziness and weakness.    Social History  Substance Use Topics  . Smoking status: Former Engineer, civil (consulting)moker    Quit  date: 09/09/2009  . Smokeless tobacco: Never Used  . Alcohol use No   Objective:   BP 106/72 (BP Location: Left Arm, Patient Position: Sitting, Cuff Size: Normal)   Pulse 69   Temp 97.6 F (36.4 C) (Oral)   Resp 16   Wt 172 lb (78 kg)   SpO2 97% Comment: room air  BMI 30.47 kg/m   Physical Exam   General Appearance:    Alert, cooperative, no distress  Eyes:    PERRL, conjunctiva/corneas clear, EOM's intact       Lungs:     Clear to auscultation bilaterally, respirations unlabored   Heart:    Regular rate and rhythm  Neurologic:   Awake, alert, oriented x 3. No apparent focal neurological           defect.   Derm:   Extensive patches of erythematous across extremities and abdomen. Several shallow oral erosions. . No oral swelling.        Assessment & Plan:     1. Allergic reaction to drug, initial encounter Suspect reaction to cipro, she is to start prednisone taper and may resume metronidazole if rash begins to clear after starting prednisone.  - predniSONE (DELTASONE) 10 MG tablet; 6 tablets for 2 days, then 5 for 2 days, then 4 for 2 days, then 3 for 2 days, then 2 for 2 days, then 1 for 2 days.  Dispense: 42 tablet; Refill: 0        Mila Merry, MD  [facility]

## 2016-04-27 ENCOUNTER — Other Ambulatory Visit: Payer: Self-pay | Admitting: Family Medicine

## 2016-06-24 ENCOUNTER — Encounter: Payer: Self-pay | Admitting: Emergency Medicine

## 2016-06-24 ENCOUNTER — Emergency Department: Payer: BLUE CROSS/BLUE SHIELD

## 2016-06-24 ENCOUNTER — Emergency Department
Admission: EM | Admit: 2016-06-24 | Discharge: 2016-06-24 | Disposition: A | Discharge status: Home / Self Care | Payer: BLUE CROSS/BLUE SHIELD | Attending: Emergency Medicine | Admitting: Emergency Medicine

## 2016-06-24 DIAGNOSIS — Z87891 Personal history of nicotine dependence: Secondary | ICD-10-CM | POA: Insufficient documentation

## 2016-06-24 DIAGNOSIS — Z7982 Long term (current) use of aspirin: Secondary | ICD-10-CM | POA: Insufficient documentation

## 2016-06-24 DIAGNOSIS — I10 Essential (primary) hypertension: Secondary | ICD-10-CM | POA: Insufficient documentation

## 2016-06-24 DIAGNOSIS — M25519 Pain in unspecified shoulder: Secondary | ICD-10-CM

## 2016-06-24 DIAGNOSIS — M25512 Pain in left shoulder: Secondary | ICD-10-CM | POA: Insufficient documentation

## 2016-06-24 DIAGNOSIS — R0789 Other chest pain: Secondary | ICD-10-CM | POA: Insufficient documentation

## 2016-06-24 DIAGNOSIS — R110 Nausea: Secondary | ICD-10-CM | POA: Diagnosis not present

## 2016-06-24 DIAGNOSIS — Z79899 Other long term (current) drug therapy: Secondary | ICD-10-CM | POA: Diagnosis not present

## 2016-06-24 DIAGNOSIS — R079 Chest pain, unspecified: Secondary | ICD-10-CM | POA: Diagnosis present

## 2016-06-24 LAB — BASIC METABOLIC PANEL
Anion gap: 8 (ref 5–15)
BUN: 12 mg/dL (ref 6–20)
CO2: 27 mmol/L (ref 22–32)
Calcium: 9.3 mg/dL (ref 8.9–10.3)
Chloride: 107 mmol/L (ref 101–111)
Creatinine, Ser: 0.79 mg/dL (ref 0.44–1.00)
GFR calc Af Amer: >60 mL/min (ref >60)
GFR calc non Af Amer: >60 mL/min (ref >60)
Glucose, Bld: 112 mg/dL — ABNORMAL HIGH (ref 65–99)
Potassium: 3.9 mmol/L (ref 3.5–5.1)
Sodium: 142 mmol/L (ref 135–145)

## 2016-06-24 LAB — CBC
HCT: 40 % (ref 35.0–47.0)
Hemoglobin: 13.9 g/dL (ref 12.0–16.0)
MCH: 30.4 pg (ref 26.0–34.0)
MCHC: 34.7 g/dL (ref 32.0–36.0)
MCV: 87.7 fL (ref 80.0–100.0)
Platelets: 307 10*3/uL (ref 150–440)
RBC: 4.56 MIL/uL (ref 3.80–5.20)
RDW: 13.5 % (ref 11.5–14.5)
WBC: 6.4 10*3/uL (ref 3.6–11.0)

## 2016-06-24 LAB — HEPATIC FUNCTION PANEL
ALT: 20 U/L (ref 14–54)
AST: 23 U/L (ref 15–41)
Albumin: 4 g/dL (ref 3.5–5.0)
Alkaline Phosphatase: 83 U/L (ref 38–126)
Bilirubin, Direct: <0.1 mg/dL — ABNORMAL LOW (ref 0.1–0.5)
Total Bilirubin: 0.7 mg/dL (ref 0.3–1.2)
Total Protein: 6.7 g/dL (ref 6.5–8.1)

## 2016-06-24 LAB — LIPASE, BLOOD: Lipase: 31 U/L (ref 11–51)

## 2016-06-24 LAB — TROPONIN I: Troponin I: <0.03 ng/mL (ref <0.03)

## 2016-06-24 MED ORDER — ONDANSETRON 4 MG PO TBDP: ORAL_TABLET | ORAL | 0 refills | Status: DC

## 2016-06-24 NOTE — ED Triage Notes (Signed)
Pt in via POV with complaints of left side chest pain with radiation to back and left shoulder x "a couple of weeks."  Pt thought she just pulled something to begin with but pain has not resolved.  Pt reports associated shortness of breath and nausea.  Vitals WDL, NAD noted at this time.

## 2016-06-24 NOTE — ED Notes (Signed)
Pt transferred to US. 

## 2016-06-24 NOTE — Discharge Instructions (Signed)

## 2016-06-24 NOTE — ED Provider Notes (Signed)
[facility] Emergency Department Provider Note  ____________________________________________   First MD Initiated Contact with Patient 06/24/16 1101     (approximate)  I have reviewed the triage vital signs and the nursing notes.   HISTORY  Chief Complaint Chest Pain    HPI Angela Woodward is a 51 y.o. female with medical history as listed below but specifically includes hypertension, hypercholesterolemia, smoking history (quit 3 years ago), and strong family history of CAD/ACS (brother had "massive heart attack" at age 59) who presents for evaluation of approximately 3-4 weeks of episodic left-sided and central back and shoulder pain that radiates through to the lower left side of her chest.  It feels like a dull and aching pain but at times can be severe.  It typically resolves within hours.  It has been getting more frequent over the last week.  She called her PCPs office and they recommended she come to the emergency department.  Initially she thought she had pulled a muscle but it has been going on for weeks and does not seem to be related to activity.  Occasionally there is some mild shortness of breath.  She frequently has nausea when the symptoms occur and she also notes that after she eats it makes the nausea worse almost every time and frequently seems to cause the back pain that radiates to the chest.  She denies fever/chills, vomiting, lower abdominal pain, dysuria.  Nothing in particular makes the symptoms better.   Past Medical History:  Diagnosis Date  . Allergy   . Hyperlipidemia   . Hypertension     Patient Active Problem List   Diagnosis Date Noted  . Segmental colitis with rectal bleeding (HCC) 11/03/2015  . Dermatitis, eczematoid 02/04/2015  . Dizziness 02/04/2015  . Borderline diabetes 02/04/2015  . 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 Avitaminosis D 02/04/2015  . BP (high blood pressure) 01/14/2015  . Insomnia 08/03/2014  . Muscle ache  06/21/2009  . Enthesopathy 01/27/2008  . Dermatologic disease 07/08/2007  . Hypercholesterolemia without hypertriglyceridemia 07/08/2007  . Headache, migraine, intractable 12/02/1997  . Allergic rhinitis 03/27/1996  . 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 FRACTURE SURGERY  03/28/12   wrist 4 pins, 10 screws   . KNEE ARTHROSCOPY Left 08/2014   Dr. Thomasena Edis; [facility]  . VAGINAL HYSTERECTOMY  03/07/1998    Prior to Admission medications   Medication Sig Start Date End Date Taking? Authorizing Provider  cyclobenzaprine (FLEXERIL) 5 MG tablet Take 1 tablet (5 mg total) by mouth 3 (three) times daily as needed for muscle spasms. 10/24/15   [name], MD  levETIRAcetam (KEPPRA) 750 MG tablet TAKE 1 TABLET BY MOUTH EVERY NIGHT AT BEDTIME 04/29/16   [name], MD  lisinopril (PRINIVIL,ZESTRIL) 10 MG tablet TAKE 1 TABLET BY MOUTH EVERY DAY 09/23/15   [name], MD  loratadine (CLARITIN) 10 MG tablet Take 1 tablet by mouth daily. 09/29/13   [provider]  ondansetron (ZOFRAN ODT) 4 MG disintegrating tablet Allow 1-2 tablets to dissolve in your mouth every 8 hours as needed for nausea/vomiting 06/24/16   Loleta Rose, MD  pravastatin (PRAVACHOL) 40 MG tablet TAKE 1 TABLET BY MOUTH EVERY NIGHT AT BEDTIME 10/26/15   [name], MD  propranolol (INDERAL) 40 MG tablet TAKE 1 TABLET BY MOUTH TWICE DAILY 10/26/15   [name], MD  QUEtiapine (SEROQUEL) 25 MG tablet TAKE 1 TABLET BY MOUTH  EVERY NIGHT AT BEDTIME 04/29/16   [name], MD    Allergies Maxalt  [rizatriptan benzoate]; Cephalosporins; Codeine; Erythromycin; Ketorolac tromethamine; Penicillins; Simvastatin; Sulfa antibiotics; and Tetanus toxoid  Family History  Problem Relation Age of Onset  . Cancer Father   . Lung cancer Father   . Hypertension Father   . Mental illness Father   . Obesity Brother   . Hypertension Brother   .  Diabetes Brother   . Hypertension Brother   . Obesity Brother   . Stroke Brother Early 30's       paralyses of left side of body  . Heart attack Brother 32  . Leukemia Mother     Social History Social History  Substance Use Topics  . Smoking status: Former Smoker    Quit date: 09/09/2009  . Smokeless tobacco: Never Used  . Alcohol use No    Review of Systems Constitutional: No fever/chills Eyes: No visual changes. ENT: No sore throat. Cardiovascular: Pain in the left and central back that seems to radiate to the chest, episodic for several weeks, getting more frequent Respiratory: Occasional shortness of breath associated with the chest and back discomfort Gastrointestinal: Intermittent/episodic epigastric pain radiating either to or from the back, frequent nausea after eating, no vomiting, no diarrhea, no constipation Genitourinary: Negative for dysuria. Musculoskeletal: Pain between her shoulders but more commonly on the left side that radiates through to her chest, episodic over several weeks. Negative for neck pain.   Integumentary: Negative for rash. Neurological: Negative for headaches, focal weakness or numbness.   ____________________________________________   PHYSICAL EXAM:  VITAL SIGNS: ED Triage Vitals  Enc Vitals Group     BP 06/24/16 1050 135/85     Pulse Rate 06/24/16 1050 69     Resp 06/24/16 1050 18     Temp 06/24/16 1050 98.8 F (37.1 C)     Temp Source 06/24/16 1050 Oral     SpO2 06/24/16 1050 99 %     Weight 06/24/16 1050 77.1 kg (170 lb)     Height 06/24/16 1050 1.6 m (5\' 3" )     Head Circumference --      Peak Flow --      Pain Score 06/24/16 1049 4     Pain Loc --      Pain Edu? --      Excl. in GC? --     Constitutional: Alert and oriented. Well appearing and in no acute distress. Eyes: Conjunctivae are normal.  Head: Atraumatic. Nose: No congestion/rhinnorhea. Mouth/Throat: Mucous membranes are moist. Neck: No stridor.  No meningeal  signs.   Cardiovascular: Normal rate, regular rhythm. Good peripheral circulation. Grossly normal heart sounds. No reproducible chest wall tenderness Respiratory: Normal respiratory effort.  No retractions. Lungs CTAB. Gastrointestinal: Mild obesity.  Tenderness to palpation with some mild guarding of the epigastrium and right upper quadrant.  Equivocal Murphy's sign, difficult to appreciate if she is anticipating it will hurt or if she is specifically reacting to palpation but she is tender.  No lower abdominal tenderness to palpation and specifically no tenderness at McBurney's point. Musculoskeletal: No lower extremity tenderness nor edema. No gross deformities of extremities. Neurologic:  Normal speech and language. No gross focal neurologic deficits are appreciated.  Skin:  Skin is warm, dry and intact. No rash noted. Psychiatric: Mood and affect are normal. Speech and behavior are normal.  ____________________________________________   LABS (all labs ordered are listed, but only abnormal results are displayed)  Labs Reviewed  BASIC METABOLIC PANEL - Abnormal; Notable for the following:       Result Value   Glucose, Bld 112 (*)    All other components within normal limits  HEPATIC FUNCTION PANEL - Abnormal; Notable for the following:    Bilirubin, Direct <0.1 (*)    All other components within normal limits  CBC  TROPONIN I  LIPASE, BLOOD   ____________________________________________  EKG  ED ECG REPORT I, Saranda Legrande, the attending physician, personally viewed and interpreted this ECG.  Date: 06/24/2016 EKG Time: 10:41 Rate: 65 Rhythm: normal sinus rhythm QRS Axis: normal Intervals: normal ST/T Wave abnormalities: inverted T wave in lead III Conduction Disturbances: none Narrative Interpretation: unremarkable  ____________________________________________  RADIOLOGY   Dg Chest 2 View  Result Date: 06/24/2016 CLINICAL DATA:  Chest pain EXAM: CHEST  2 VIEW  COMPARISON:  02/24/2013 FINDINGS: Heart and mediastinal contours are within normal limits. No focal opacities or effusions. No acute bony abnormality. IMPRESSION: No active cardiopulmonary disease. Electronically Signed   By: Charlett NoseKevin  Dover M.D.   On: 06/24/2016 11:30   [place] Abdomen Limited Ruq  Result Date: 06/24/2016 CLINICAL DATA:  Nausea, epigastric pain, right lower quadrant tenderness EXAM: US ABDOMEN LIMITED - RIGHT UPPER QUADRANT COMPARISON:  10/31/2015 FINDINGS: Gallbladder: Gallbladder is contracted.  No wall thickening or gallstones. Common bile duct: Diameter: Normal caliber, 3 mm Liver: Normal echotexture.  No focal abnormality. IMPRESSION: Contracted gallbladder.  No stones or evidence of cholecystitis. Electronically Signed   By: Charlett NoseKevin  Dover M.D.   On: 06/24/2016 12:15    ____________________________________________   PROCEDURES  Critical Care performed: No   Procedure(s) performed:   Procedures   ____________________________________________   INITIAL IMPRESSION / ASSESSMENT AND PLAN / ED COURSE  Pertinent labs & imaging results that were available during my care of the patient were reviewed by me and considered in my medical decision making (see chart for details).  HEART score 4-5, but the symptoms been going on for 3-4 weeks and I do not feel this represents an acute or emergent condition.  However based on her symptoms and the tenderness to palpation of her upper abdomen I strongly suspect she may be suffering from biliary colic.  The symptoms seem to be worse after she eats and include nausea with epigastric pain and back pain.  I had a discussion with her about the ACS spectrum as well as biliary colic and we agreed to perform both workups including the ultrasound.  I will reassess after the results that she is currently not in any pain and if the workup is generally negative she would prefer to follow up as an outpatient rather than coming into the hospital and I feel  this is appropriate.  I will give her a full dose aspirin if her results are negative for  Gallbladder disease and I will establish a follow-up appointment for her with cardiology.  Clinical Course as of Jun 24 1248  Sun Jun 24, 2016  1153 Reassuring CXR report with no indication of acute illness. DG Chest 2 View [CF]  1218 No evidence of gallstones US Abdomen Limited RUQ [CF]  1240 The patient's workup has been normal and reassuring.  I specifically ask her and she last had her chest pain yesterday.  Given that the pain has not been present since yesterday and that is been going on for 3 or 4 weeks I do not feel there is any benefit to a repeat troponin.  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  She understands and agrees with plan.  She already takes a daily baby aspirin and prefers to take 3 more at home today to be equivalent to a full dose aspirin rather than me giving her one here, and I think that is appropriate for cost benefit purpose.  She will call cardiology tomorrow to set up a follow up appointment.    I gave my usual and customary return precautions.     [CF]    Clinical Course User Index [CF] Loleta Rose, MD    ____________________________________________  FINAL CLINICAL IMPRESSION(S) / ED DIAGNOSES  Final diagnoses:  Atypical chest pain  Shoulder pain, unspecified chronicity, unspecified laterality  Nausea     MEDICATIONS GIVEN DURING THIS VISIT:  Medications - No data to display   NEW OUTPATIENT MEDICATIONS STARTED DURING THIS VISIT:  New Prescriptions   ONDANSETRON (ZOFRAN ODT) 4 MG DISINTEGRATING TABLET    Allow 1-2 tablets to dissolve in your mouth every 8 hours as needed for nausea/vomiting    Modified  Medications   No medications on file    Discontinued Medications   No medications on file     Note:  This document was prepared using Dragon voice recognition software and may include unintentional dictation errors.    Loleta Rose, MD 06/24/16 1250

## 2016-06-24 NOTE — ED Notes (Signed)
Pt back from US

## 2016-07-26 ENCOUNTER — Other Ambulatory Visit: Payer: Self-pay | Admitting: Family Medicine

## 2016-07-26 DIAGNOSIS — M546 Pain in thoracic spine: Secondary | ICD-10-CM

## 2016-11-01 ENCOUNTER — Other Ambulatory Visit: Payer: Self-pay | Admitting: Family Medicine

## 2016-11-01 DIAGNOSIS — I10 Essential (primary) hypertension: Secondary | ICD-10-CM

## 2016-11-01 NOTE — Telephone Encounter (Signed)
Patient has not been seen for a year. Needs to schedule office visit before refills can be approved.

## 2016-11-02 NOTE — Telephone Encounter (Signed)
Apt made for Monday 11/05/2016  Thanks,   -Vernona Rieger

## 2016-11-05 ENCOUNTER — Encounter: Payer: Self-pay | Admitting: Family Medicine

## 2016-11-05 ENCOUNTER — Ambulatory Visit (INDEPENDENT_AMBULATORY_CARE_PROVIDER_SITE_OTHER): Payer: BLUE CROSS/BLUE SHIELD | Admitting: Family Medicine

## 2016-11-05 VITALS — BP 122/72 | HR 50 | Temp 98.5°F | Resp 16 | Ht 63.5 in | Wt 178.0 lb

## 2016-11-05 DIAGNOSIS — I10 Essential (primary) hypertension: Secondary | ICD-10-CM | POA: Diagnosis not present

## 2016-11-05 DIAGNOSIS — Z1231 Encounter for screening mammogram for malignant neoplasm of breast: Secondary | ICD-10-CM

## 2016-11-05 DIAGNOSIS — G4700 Insomnia, unspecified: Secondary | ICD-10-CM

## 2016-11-05 DIAGNOSIS — E7800 Pure hypercholesterolemia, unspecified: Secondary | ICD-10-CM | POA: Diagnosis not present

## 2016-11-05 DIAGNOSIS — Z23 Encounter for immunization: Secondary | ICD-10-CM | POA: Diagnosis not present

## 2016-11-05 DIAGNOSIS — Z1239 Encounter for other screening for malignant neoplasm of breast: Secondary | ICD-10-CM

## 2016-11-05 NOTE — Progress Notes (Signed)
[name] Female    DOB: 1965/05/12   51 y.o.   MRN: [id number] Visit Date: 11/05/2016  Today's Provider: Mila Merry, MD   No chief complaint on file.  Subjective:    HPI  Hypertension, follow-up:  BP Readings from Last 3 Encounters:  06/24/16 (!) 152/90  11/08/15 106/72  11/03/15 110/68    She was last seen for hypertension 1 years ago.  BP at that visit was 102/64.  She reports good compliance with treatment. She is not having side effects.  She is not exercising. She is adherent to low salt diet.   Outside blood pressures are checked occasionally. She is experiencing none.  Patient denies exertional chest pressure/discomfort, lower extremity edema and palpitations.   Cardiovascular risk factors include dyslipidemia.   Weight trend: stable Wt Readings from Last 3 Encounters:  06/24/16 170 lb (77.1 kg)  11/08/15 172 lb (78 kg)  11/03/15 172 lb (78 kg)    Current diet: well balanced    Lipid/Cholesterol, Follow-up:   Last seen for this1 years ago.  Management changes since that visit include no changes. . Last Lipid Panel:    Component Value Date/Time   CHOL 146 02/08/2015 0814   TRIG 134 02/08/2015 0814   HDL 46 02/08/2015 0814   CHOLHDL 3.2 02/08/2015 0814   LDLCALC 73 02/08/2015 0814    Risk factors for vascular disease include hypertension  She reports good compliance with treatment. She is not having side effects.  Current symptoms include none and have been stable. Weight trend: stable Prior visit with dietician: no Current diet: well balanced Current exercise: none  Wt Readings from Last 3 Encounters:  06/24/16 170 lb (77.1 kg)  11/08/15 172 lb (78 kg)  11/03/15 172 lb (78 kg)    Insomnia, follow up: Patient was last seen in the office 1 year ago. No changes were made in her medications. Patient feels that her symptoms are well controlled with Seroquel.   Migraine headaches: Has been on Keppra originally  prescribed by neurology and reports she has not had any recent signifcnat migrain headaches. She is tolerating medication well without adverse effects.     Allergies  Allergen Reactions  . Maxalt  [Rizatriptan Benzoate] Swelling    Throat swelling.  . Cephalosporins   . Codeine   . Erythromycin   . Ketorolac Tromethamine   . Penicillins   . Simvastatin Swelling    Other reaction(s): Muscle Pain  . Sulfa Antibiotics   . Tetanus Toxoid     Other reaction(s): Swelling     Current Outpatient Prescriptions:  .  cyclobenzaprine (FLEXERIL) 5 MG tablet, TAKE 1 TABLET(5 MG) BY MOUTH THREE TIMES DAILY AS NEEDED FOR MUSCLE SPASMS, Disp: 20 tablet, Rfl: 5 .  levETIRAcetam (KEPPRA) 750 MG tablet, TAKE 1 TABLET BY MOUTH EVERY NIGHT AT BEDTIME, Disp: 90 tablet, Rfl: 3 .  lisinopril (PRINIVIL,ZESTRIL) 10 MG tablet, TAKE 1 TABLET BY MOUTH EVERY DAY, Disp: 30 tablet, Rfl: 0 .  loratadine (CLARITIN) 10 MG tablet, Take 1 tablet by mouth daily., Disp: , Rfl:  .  ondansetron (ZOFRAN ODT) 4 MG disintegrating tablet, Allow 1-2 tablets to dissolve in your mouth every 8 hours as needed for nausea/vomiting, Disp: 30 tablet, Rfl: 0 .  pravastatin (PRAVACHOL) 40 MG tablet, TAKE 1 TABLET BY MOUTH EVERY NIGHT AT BEDTIME, Disp: 30 tablet, Rfl: 0 .  propranolol (INDERAL) 40 MG tablet, TAKE 1 TABLET BY MOUTH TWICE DAILY, Disp: 60 tablet,  Rfl: 0 .  QUEtiapine (SEROQUEL) 25 MG tablet, TAKE 1 TABLET BY MOUTH EVERY NIGHT AT BEDTIME, Disp: 90 tablet, Rfl: 3  Review of Systems  Constitutional: Negative.   Respiratory: Negative.   Cardiovascular: Negative.   Musculoskeletal: Negative.   Allergic/Immunologic: Negative.   Neurological: Negative.   Psychiatric/Behavioral: Negative.     Social History  Substance Use Topics  . Smoking status: Former Smoker    Quit date: 09/09/2009  . Smokeless tobacco: Never Used  . Alcohol use No   Objective:     BP 122/72 (BP Location: Left Arm, Patient Position: Sitting, Cuff  Size: Normal)   Pulse (!) 50   Temp 98.5 F (36.9 C)   Resp 16   Ht 5' 3.5" (1.613 m)   Wt 178 lb (80.7 kg)   SpO2 98%   BMI 31.04 kg/m   Vitals:   11/05/16 1349  BP: 122/72  Pulse: (!) 50  Resp: 16  Temp: 98.5 F (36.9 C)  SpO2: 98%  Weight: 178 lb (80.7 kg)  Height: 5' 3.5" (1.613 m)     Physical Exam  General Appearance:    Alert, cooperative, no distress  Eyes:    PERRL, conjunctiva/corneas clear, EOM's intact       Lungs:     Clear to auscultation bilaterally, respirations unlabored  Heart:    Regular rate and rhythm  Neurologic:   Awake, alert, oriented x 3. No apparent focal neurological           defect.           Assessment & Plan:     1. Essential hypertension Well controlled.  Continue current medications.   - COMPLETE METABOLIC PANEL WITH GFR  2. Hypercholesterolemia without hypertriglyceridemia She is tolerating pravastatin well with no adverse effects.   - Lipid panel  3. Insomnia, unspecified type Doing well with Seroquel with no adverse effects.   4. Influenza vaccine needed  - Flu Vaccine QUAD 6+ mos PF IM (Fluarix Quad PF)  5. Breast cancer screening  - MM Digital Screening; Future       Mila Merry, MD  [facility]

## 2016-11-05 NOTE — Patient Instructions (Signed)
Please call the [facility] ([phone]) to schedule a routine screening mammogram.  

## 2016-11-06 LAB — COMPLETE METABOLIC PANEL WITH GFR
AG Ratio: 1.8 (calc) (ref 1.0–2.5)
ALT: 17 U/L (ref 6–29)
AST: 19 U/L (ref 10–35)
Albumin: 4.1 g/dL (ref 3.6–5.1)
Alkaline phosphatase (APISO): 88 U/L (ref 33–130)
BUN: 12 mg/dL (ref 7–25)
CO2: 29 mmol/L (ref 20–32)
Calcium: 9.3 mg/dL (ref 8.6–10.4)
Chloride: 103 mmol/L (ref 98–110)
Creat: 0.8 mg/dL (ref 0.50–1.05)
GFR, Est African American: 98 mL/min/{1.73_m2} (ref >=60)
GFR, Est Non African American: 85 mL/min/{1.73_m2} (ref >=60)
Globulin: 2.3 g/dL (calc) (ref 1.9–3.7)
Glucose, Bld: 98 mg/dL (ref 65–99)
Potassium: 4.5 mmol/L (ref 3.5–5.3)
Sodium: 139 mmol/L (ref 135–146)
Total Bilirubin: 0.7 mg/dL (ref 0.2–1.2)
Total Protein: 6.4 g/dL (ref 6.1–8.1)

## 2016-11-06 LAB — LIPID PANEL
Cholesterol: 154 mg/dL (ref <200)
HDL: 42 mg/dL — ABNORMAL LOW (ref >50)
LDL Cholesterol (Calc): 84 mg/dL (calc)
Non-HDL Cholesterol (Calc): 112 mg/dL (calc) (ref <130)
Total CHOL/HDL Ratio: 3.7 (calc) (ref <5.0)
Triglycerides: 181 mg/dL — ABNORMAL HIGH (ref <150)

## 2016-11-09 ENCOUNTER — Ambulatory Visit
Admission: RE | Admit: 2016-11-09 | Discharge: 2016-11-09 | Disposition: A | Discharge status: Home / Self Care | Payer: BLUE CROSS/BLUE SHIELD | Source: Ambulatory Visit | Attending: Family Medicine | Admitting: Family Medicine

## 2016-11-09 DIAGNOSIS — Z1231 Encounter for screening mammogram for malignant neoplasm of breast: Secondary | ICD-10-CM | POA: Diagnosis present

## 2016-11-09 DIAGNOSIS — Z1239 Encounter for other screening for malignant neoplasm of breast: Secondary | ICD-10-CM

## 2016-11-29 ENCOUNTER — Other Ambulatory Visit: Payer: Self-pay | Admitting: Family Medicine

## 2016-11-29 DIAGNOSIS — I10 Essential (primary) hypertension: Secondary | ICD-10-CM

## 2017-03-29 ENCOUNTER — Other Ambulatory Visit: Payer: Self-pay | Admitting: Family Medicine

## 2017-04-27 ENCOUNTER — Other Ambulatory Visit: Payer: Self-pay | Admitting: Family Medicine

## 2017-09-16 IMAGING — US US ABDOMEN LIMITED
1 series · 14 of 25 positions shown · non-contrast
Comparison: 10/31/2015

CLINICAL DATA: Nausea, epigastric pain, right lower quadrant
tenderness

EXAM:
US ABDOMEN LIMITED - RIGHT UPPER QUADRANT

[Series 1: us abdomen limited · 0.17mm/px · 14 of 41 slices shown]
[im 1/41]
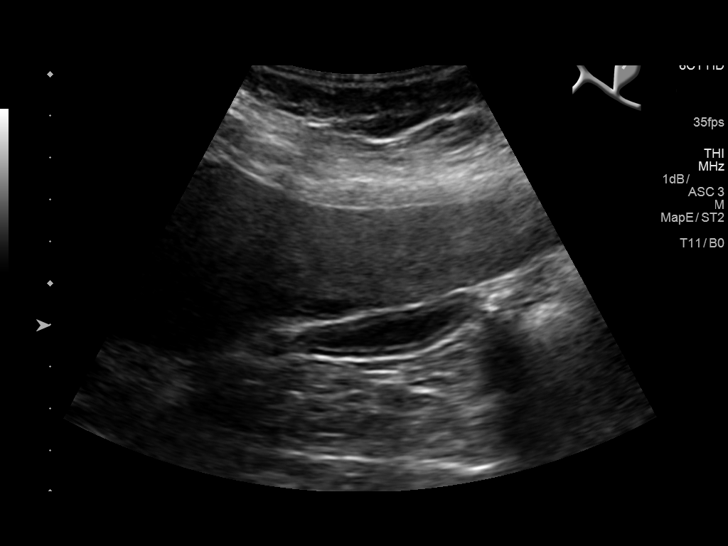
[im 4/41]
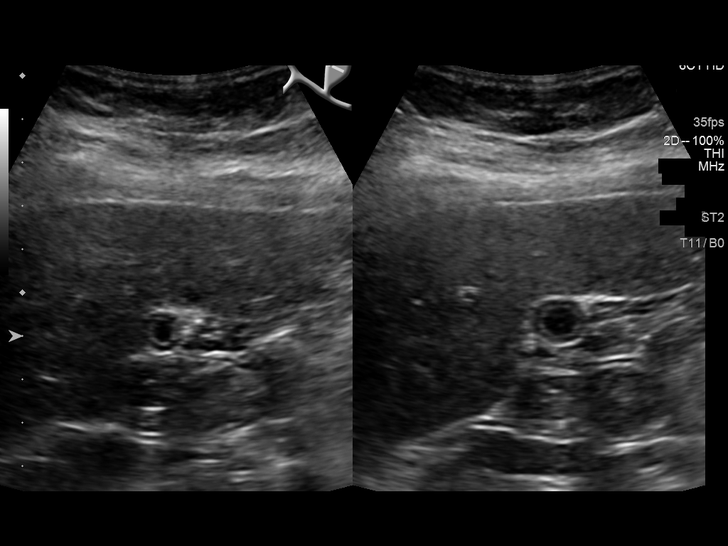
[im 7/41]
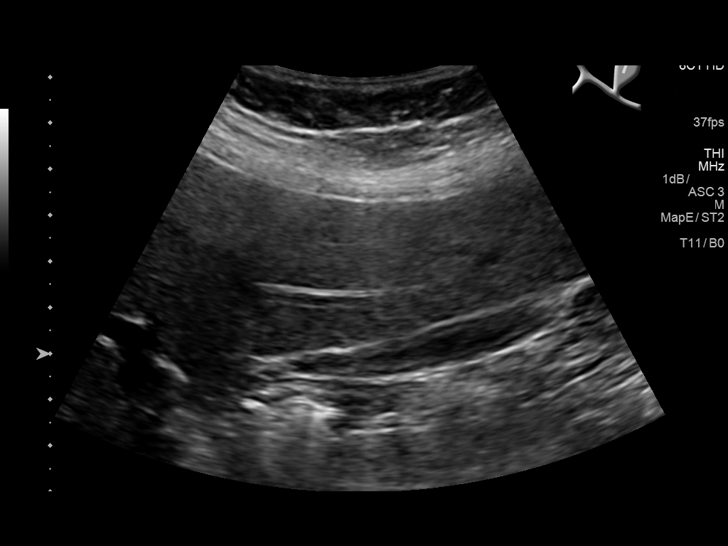
[im 11/41]
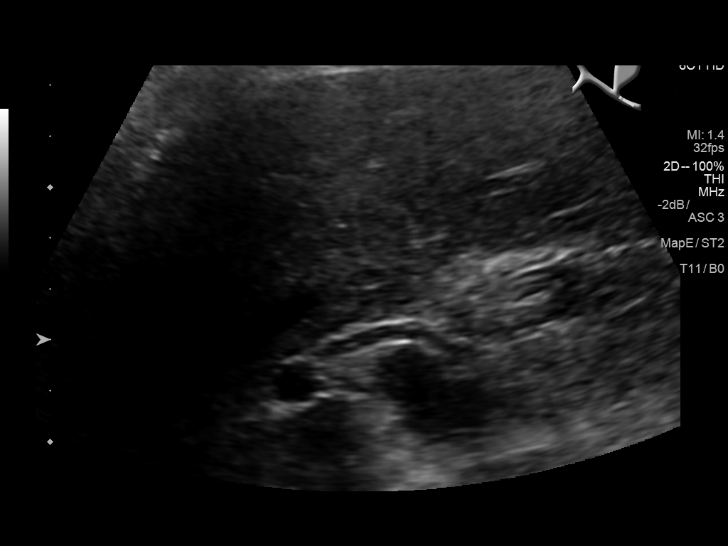
[im 14/41]
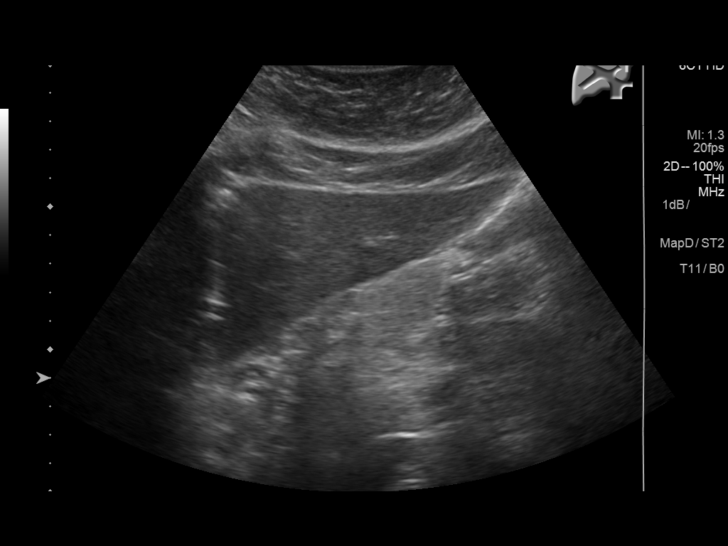
[im 16/41]
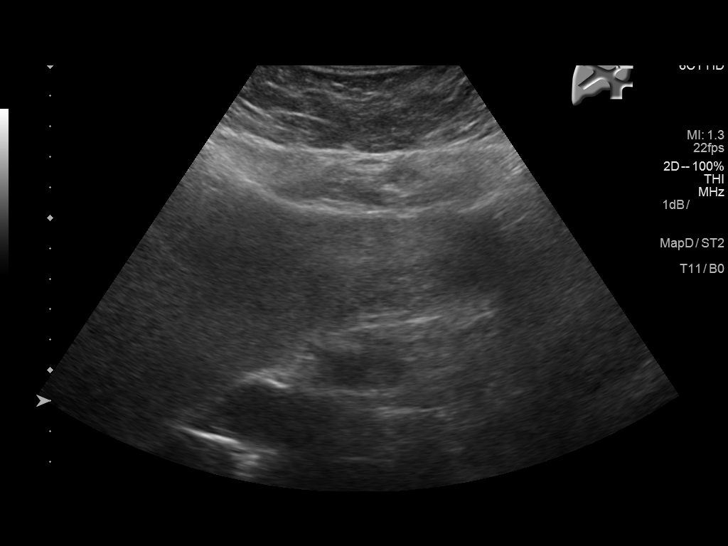
[im 19/41]
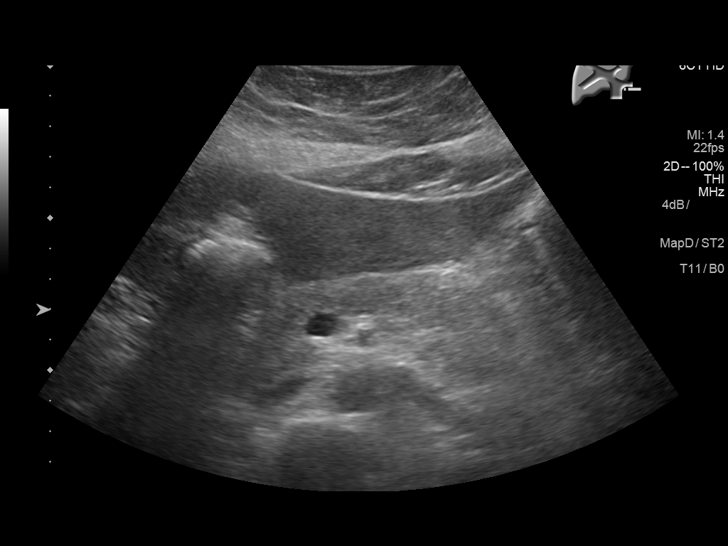
[im 22/41]
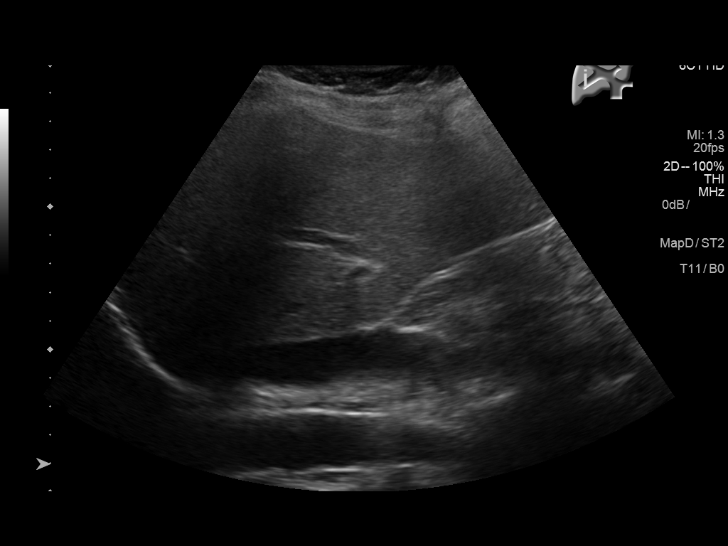
[im 26/41]
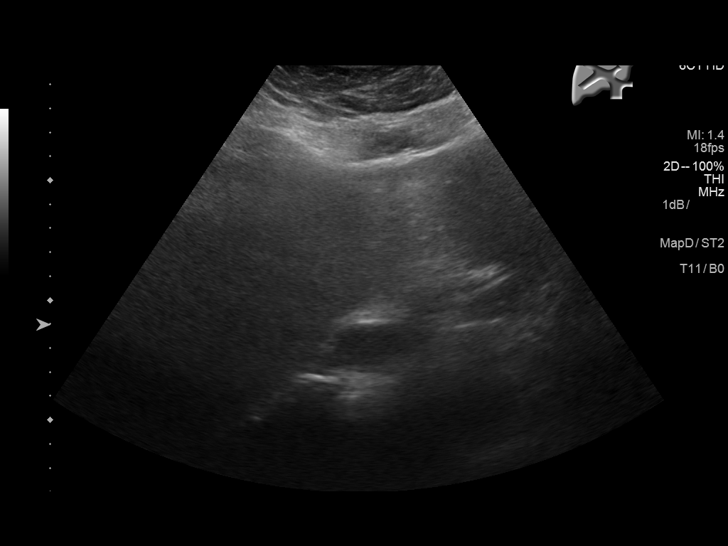
[im 27/41]
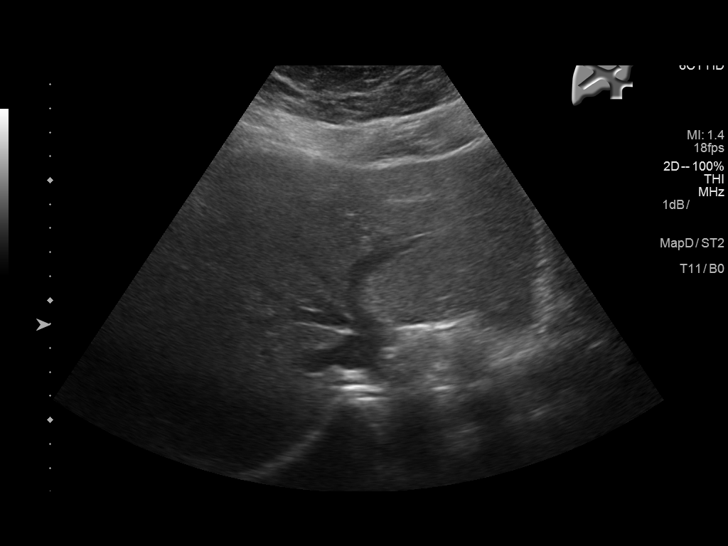
[im 31/41]
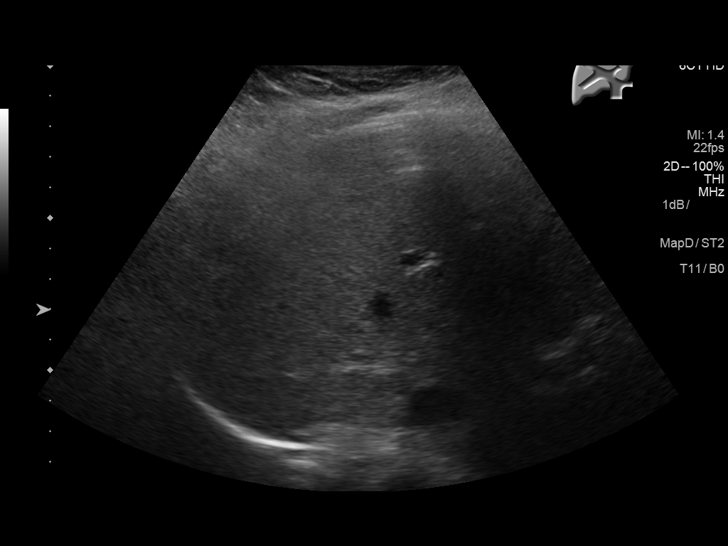
[im 34/41]
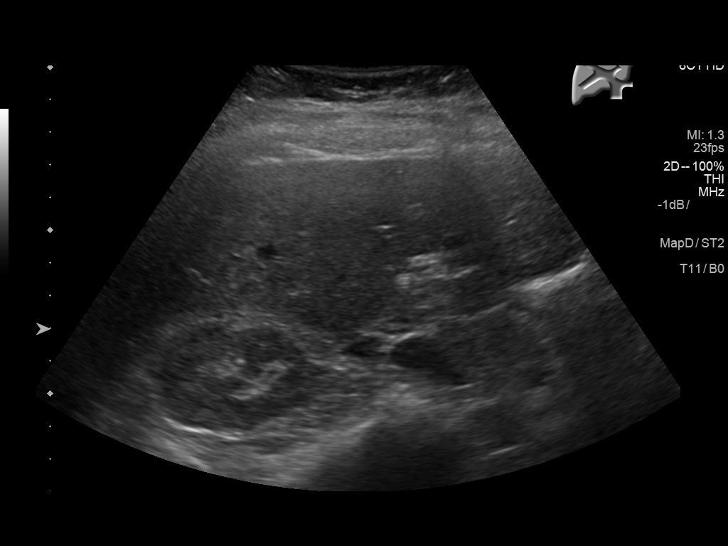
[im 37/41]
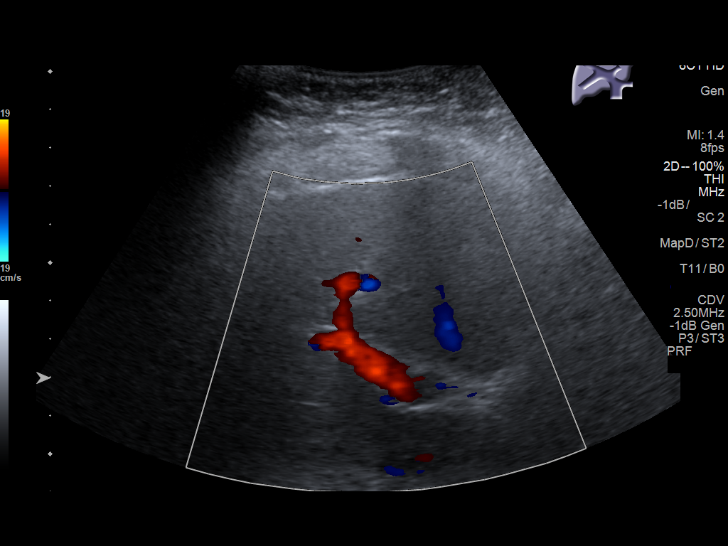
[im 41/41]
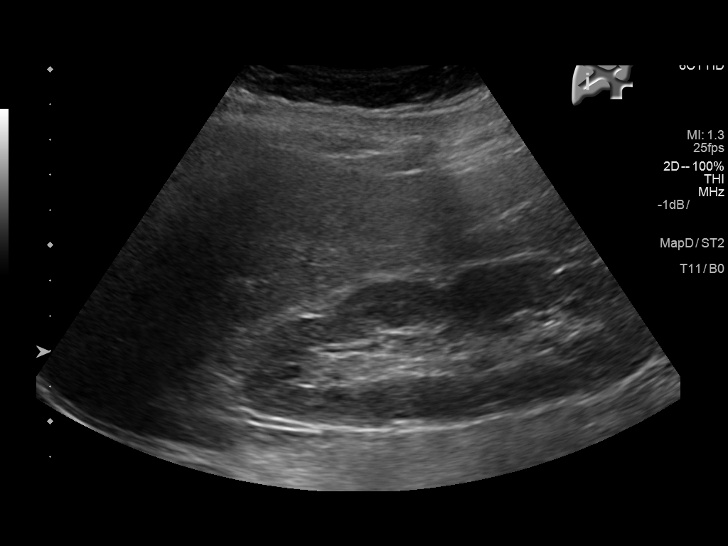

[14 of 25 positions shown; findings below may reference images not displayed]

FINDINGS: Gallbladder:

Gallbladder is contracted.  No wall thickening or gallstones.

Common bile duct:

Diameter: Normal caliber, 3 mm

Liver:

Normal echotexture.  No focal abnormality.
IMPRESSION: Contracted gallbladder.  No stones or evidence of cholecystitis.

## 2017-09-29 IMAGING — CT CT ABD-PELV W/ CM
2 of 5 series · 16 of 46 positions shown, 18 images · IV contrast (APPLIED)
Comparison: None.

CLINICAL DATA: Bloody stools

EXAM:
CT ABDOMEN AND PELVIS WITH CONTRAST
TECHNIQUE: Multidetector CT imaging of the abdomen and pelvis was performed
using the standard protocol following bolus administration of
intravenous contrast.
CONTRAST:  100mL X6PPT4-M99 IOPAMIDOL (X6PPT4-M99) INJECTION 61%

[Series 2: axial st · axial · 0.74mm/px · z∈[-611,-206]mm · 13 of 91 slices shown, 15 images]
[im 5/91  soft-tissue]
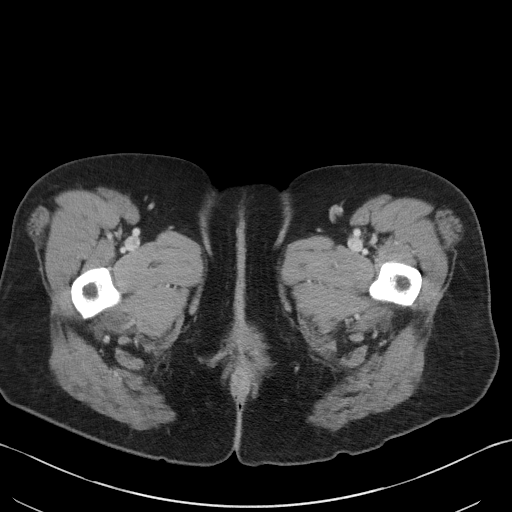
[im 5/91  bone]
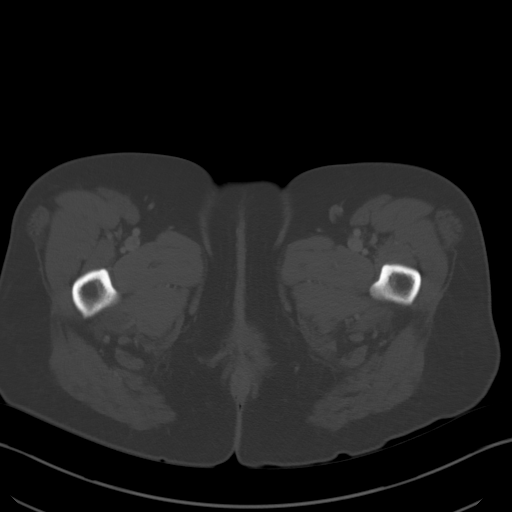
[im 15/91  soft-tissue]
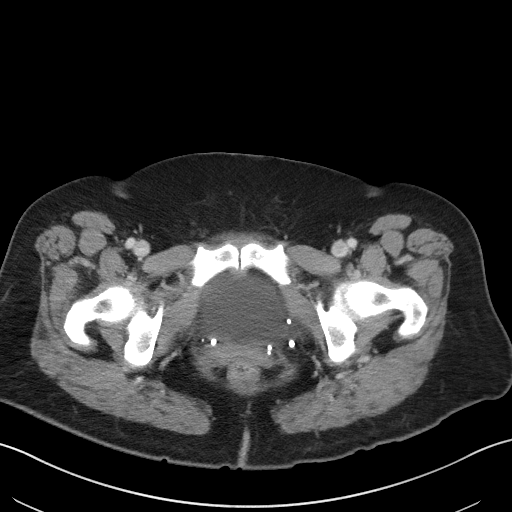
[im 19/91  soft-tissue]
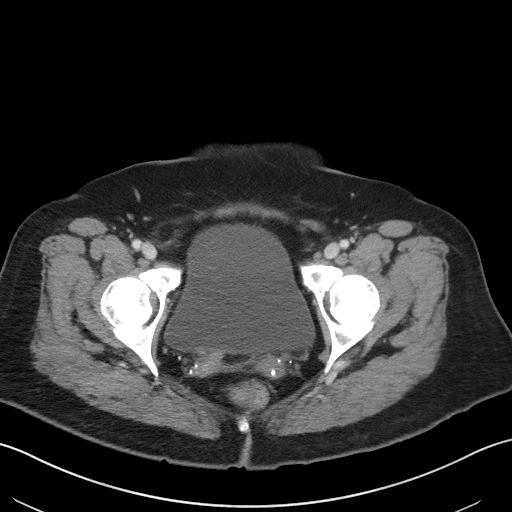
[im 24/91  soft-tissue]
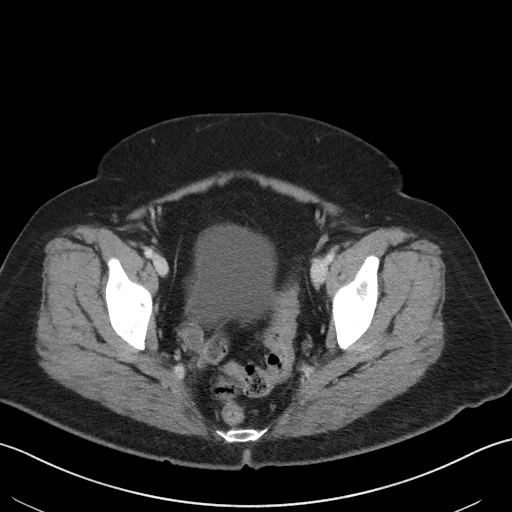
[im 34/91  soft-tissue]
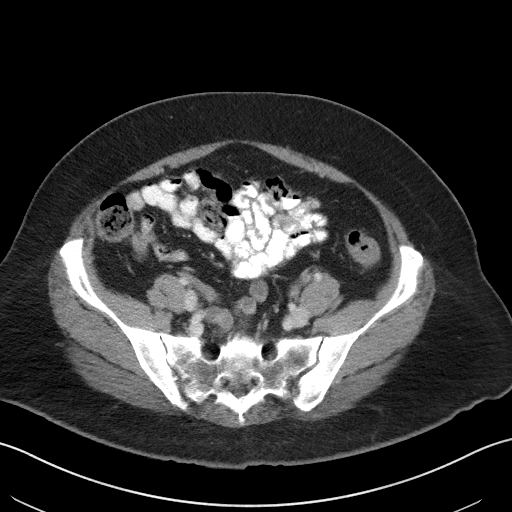
[im 38/91  soft-tissue]
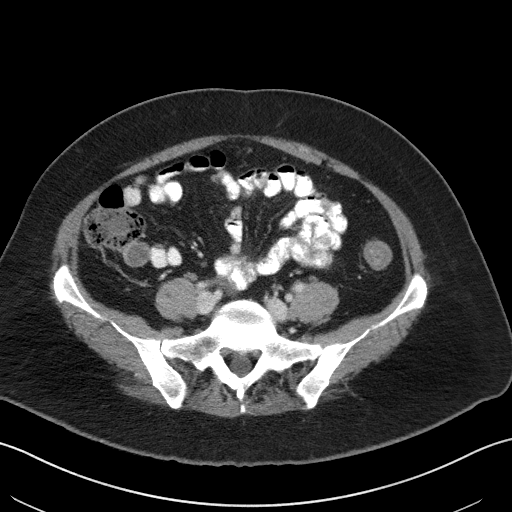
[im 48/91  soft-tissue]
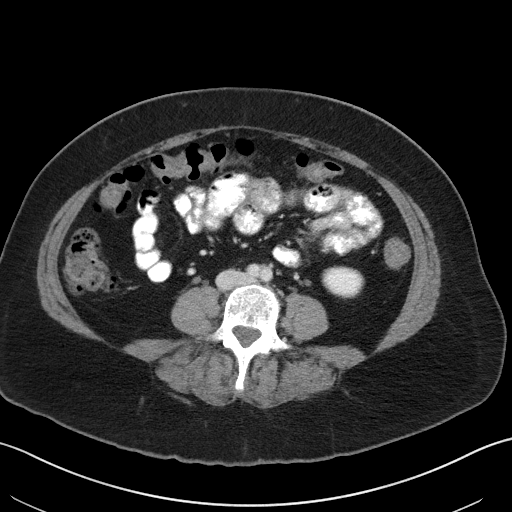
[im 53/91  soft-tissue]
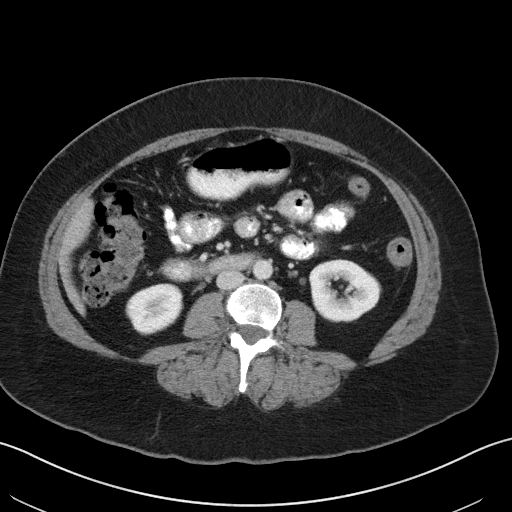
[im 57/91  soft-tissue]
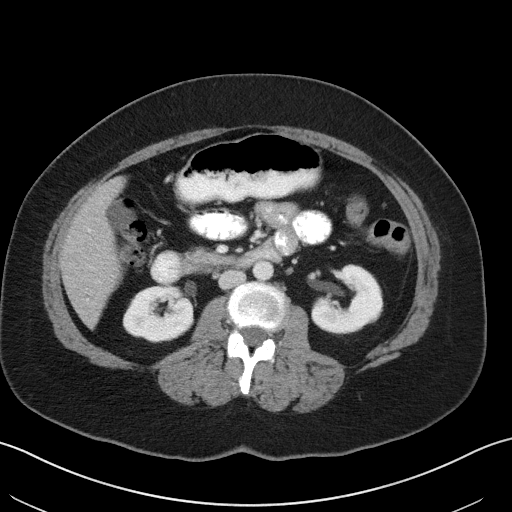
[im 57/91  bone]
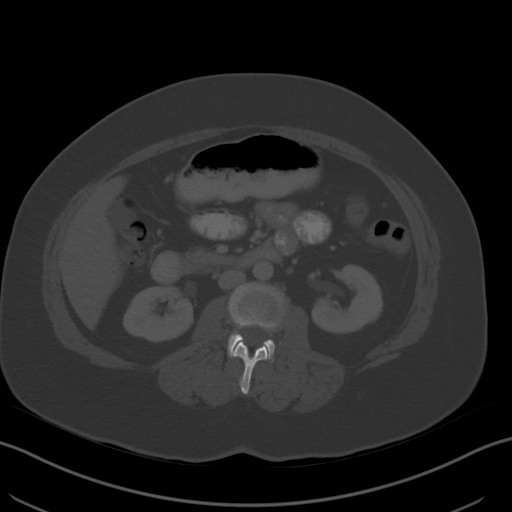
[im 67/91  soft-tissue]
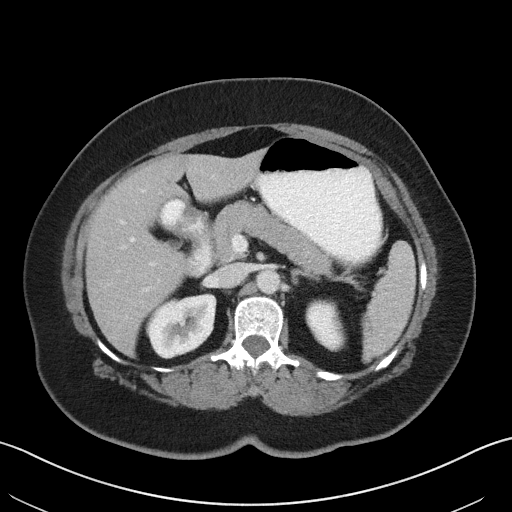
[im 72/91  soft-tissue]
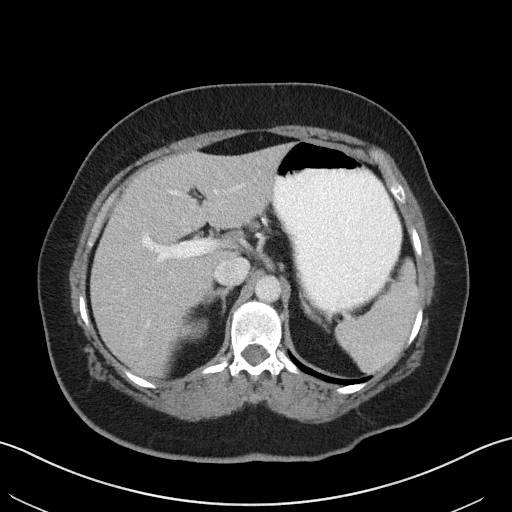
[im 76/91  soft-tissue]
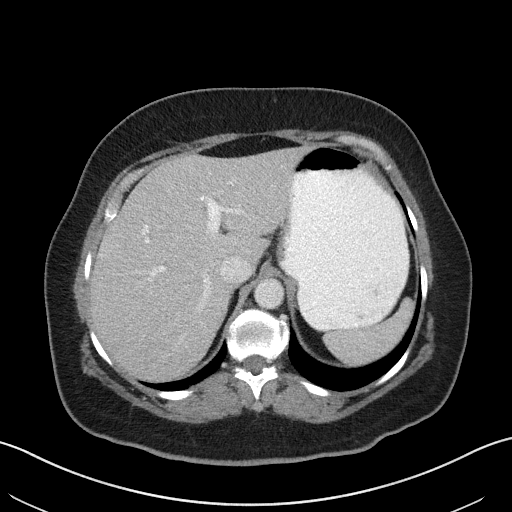
[im 86/91  soft-tissue]
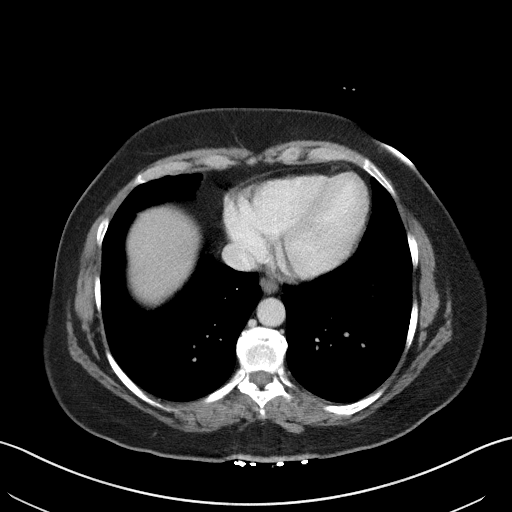

[Series 5: coronal st · coronal · 0.68mm/px · 3 of 96 slices shown]
[im 32/96  soft-tissue]
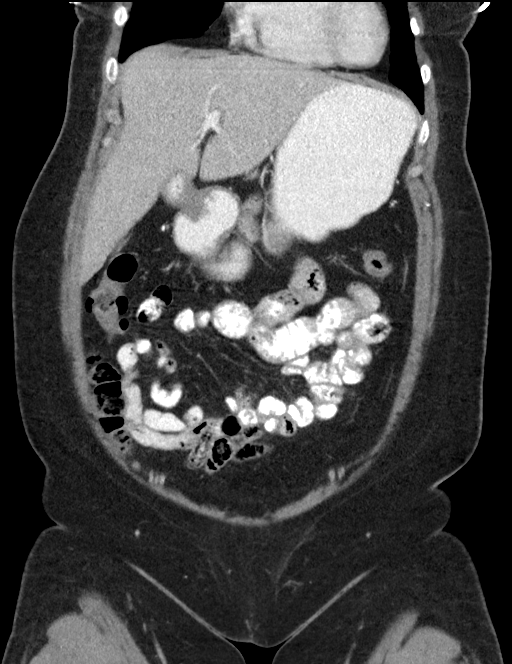
[im 43/96  soft-tissue]
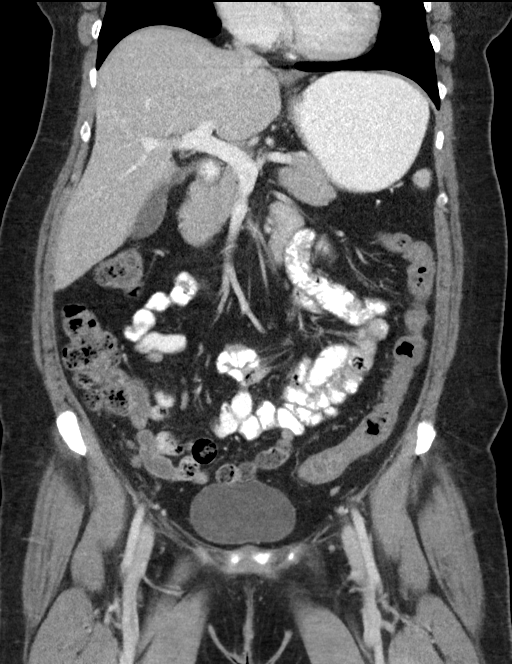
[im 53/96  soft-tissue]
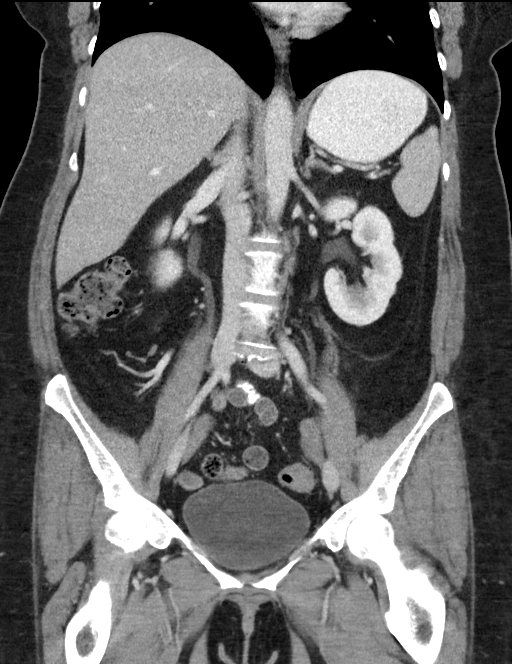

[16 of 46 positions shown; findings below may reference images not displayed]

FINDINGS: Lower chest: No acute abnormality.

Hepatobiliary: No focal liver abnormality is seen. No gallstones,
gallbladder wall thickening, or biliary dilatation.

Pancreas: Unremarkable. No pancreatic ductal dilatation or
surrounding inflammatory changes.

Spleen: Normal in size without focal abnormality.

Adrenals/Urinary Tract: Normal appearance of the adrenal glands. The
kidneys are unremarkable. No hydronephrosis or hydroureter. The
urinary bladder appears normal.

Stomach/Bowel: The stomach is normal. The small bowel loops have a
normal course and caliber. The appendix is visualized and appears
normal. From the splenic flexure to the mid sigmoid colon there is
abnormal wall thickening and hyperemia of the Vasa recta compatible
with segmental colitis. There is no pneumatosis or bowel
perforation.

Vascular/Lymphatic: No significant vascular findings are present. No
enlarged abdominal or pelvic lymph nodes.

Reproductive: Status post hysterectomy. No adnexal masses.

Other: No abdominal wall hernia or abnormality. No abdominopelvic
ascites.

Musculoskeletal: No acute or significant osseous findings.
IMPRESSION: 1. Examination is positive for segmental colitis from the splenic
flexure to the mid sigmoid colon. No pneumatosis, perforation or
bowel obstruction.

## 2017-10-09 ENCOUNTER — Encounter: Payer: Self-pay | Admitting: Family Medicine

## 2017-10-09 ENCOUNTER — Other Ambulatory Visit: Payer: Self-pay | Admitting: Family Medicine

## 2017-10-09 ENCOUNTER — Ambulatory Visit (INDEPENDENT_AMBULATORY_CARE_PROVIDER_SITE_OTHER): Payer: BLUE CROSS/BLUE SHIELD | Admitting: Family Medicine

## 2017-10-09 VITALS — BP 102/69 | HR 67 | Temp 98.4°F | Resp 16 | Ht 64.0 in | Wt 169.0 lb

## 2017-10-09 DIAGNOSIS — G4700 Insomnia, unspecified: Secondary | ICD-10-CM

## 2017-10-09 DIAGNOSIS — Z8669 Personal history of other diseases of the nervous system and sense organs: Secondary | ICD-10-CM

## 2017-10-09 DIAGNOSIS — I10 Essential (primary) hypertension: Secondary | ICD-10-CM | POA: Diagnosis not present

## 2017-10-09 DIAGNOSIS — R7303 Prediabetes: Secondary | ICD-10-CM | POA: Diagnosis not present

## 2017-10-09 DIAGNOSIS — Z0000 Encounter for general adult medical examination without abnormal findings: Secondary | ICD-10-CM | POA: Diagnosis not present

## 2017-10-09 DIAGNOSIS — Z23 Encounter for immunization: Secondary | ICD-10-CM

## 2017-10-09 DIAGNOSIS — E7800 Pure hypercholesterolemia, unspecified: Secondary | ICD-10-CM | POA: Diagnosis not present

## 2017-10-09 DIAGNOSIS — Z1231 Encounter for screening mammogram for malignant neoplasm of breast: Secondary | ICD-10-CM

## 2017-10-09 DIAGNOSIS — Z1211 Encounter for screening for malignant neoplasm of colon: Secondary | ICD-10-CM

## 2017-10-09 DIAGNOSIS — G43909 Migraine, unspecified, not intractable, without status migrainosus: Secondary | ICD-10-CM

## 2017-10-09 DIAGNOSIS — M654 Radial styloid tenosynovitis [de Quervain]: Secondary | ICD-10-CM

## 2017-10-09 DIAGNOSIS — E559 Vitamin D deficiency, unspecified: Secondary | ICD-10-CM

## 2017-10-09 DIAGNOSIS — Z1239 Encounter for other screening for malignant neoplasm of breast: Secondary | ICD-10-CM

## 2017-10-09 MED ORDER — NAPROXEN 500 MG PO TABS: 500.0000 mg | ORAL_TABLET | Freq: Two times a day (BID) | ORAL | 0 refills | Status: DC | PRN

## 2017-10-09 NOTE — Progress Notes (Signed)
[name], Female    DOB: 02-16-1965, 52 y.o.   MRN: [id number] Visit Date: 10/09/2017  Today's Provider: Mila Merry, MD   Chief Complaint  Patient presents with  . Annual Exam   Subjective:    Annual physical exam Angela Woodward is a 52 y.o. female who presents today for health maintenance and complete physical. She feels fairly well. She reports exercising daily. She reports she is sleeping fairly well.  -----------------------------------------------------------------  Hypertension, follow-up:  BP Readings from Last 3 Encounters:  10/09/17 102/69  11/05/16 122/72  06/24/16 (!) 152/90    She was last seen for hypertension 11 months ago.  BP at that visit was 122/72. Management since that visit includes no changes. She reports good compliance with treatment. She is not having side effects.  She is exercising. She is adherent to low salt diet.   Outside blood pressures are checked occasionally. She is experiencing fatigue.  Patient denies chest pain, chest pressure/discomfort, claudication, dyspnea, exertional chest pressure/discomfort, irregular heart beat, lower extremity edema, near-syncope, orthopnea, palpitations, paroxysmal nocturnal dyspnea, syncope and tachypnea.   Cardiovascular risk factors include hypertension.  Use of agents associated with hypertension: none.     Weight trend: fluctuating a bit Wt Readings from Last 3 Encounters:  10/09/17 169 lb (76.7 kg)  11/05/16 178 lb (80.7 kg)  06/24/16 170 lb (77.1 kg)    Current diet: well balanced  ------------------------------------------------------------------------  Lipid/Cholesterol, Follow-up:   Last seen for this 11 months ago.  Management changes since that visit include none. . Last Lipid Panel:    Component Value Date/Time   CHOL 154 11/06/2016 0809   CHOL 146 02/08/2015 0814   TRIG 181 (H) 11/06/2016 0809   HDL 42 (L) 11/06/2016 0809   HDL 46 02/08/2015 0814    CHOLHDL 3.7 11/06/2016 0809   LDLCALC 84 11/06/2016 0809    Risk factors for vascular disease include hypercholesterolemia and hypertension  She reports good compliance with treatment. She is not having side effects.  Current symptoms include none and have been stable. Weight trend: fluctuating a bit Prior visit with dietician: no Current diet: well balanced Current exercise: walking  Wt Readings from Last 3 Encounters:  10/09/17 169 lb (76.7 kg)  11/05/16 178 lb (80.7 kg)  06/24/16 170 lb (77.1 kg)    ------------------------------------------------------------------- Follow up of Insomnia: Patient was last seen for this problem 11 months ago and no changes were made. She states she is taking Seroquel primarily to help her sleep, which in turn helps with her migraines.   Follow up Migraine She continues on Keppra originally prescribed by neurology for headache prophylaxis. States it is working well, and is well tolerated. Still has infrequent headaches which are not severe.   She also states she has history of ulcerative colitis that flared up a few weeks ago. Her last colonoscopy was in 2009 by Dr. Servando Snare and was normal. She does have a brother who died  Of colon cancer  She also reports she has had pain in her right finger and thumb for the last two weeks and not improving. Has had no known injury. Minimal relief with OTC tylenol and ibuprofen.  Review of Systems  Constitutional: Positive for diaphoresis and fatigue. Negative for chills and fever.  HENT: Negative for congestion, ear pain, rhinorrhea, sneezing and sore throat.   Eyes: Positive for photophobia. Negative for pain and redness.  Respiratory: Negative for cough, shortness of breath and wheezing.  Cardiovascular: Negative for chest pain and leg swelling.  Gastrointestinal: Positive for abdominal distention, abdominal pain and nausea. Negative for blood in stool, constipation and diarrhea.  Endocrine: Negative for  polydipsia and polyphagia.  Genitourinary: Negative.  Negative for dysuria, flank pain, hematuria, pelvic pain, vaginal bleeding and vaginal discharge.  Musculoskeletal: Positive for arthralgias. Negative for back pain, gait problem and joint swelling.  Skin: Negative for rash.  Neurological: Negative.  Negative for dizziness, tremors, seizures, weakness, light-headedness, numbness and headaches.  Hematological: Negative for adenopathy.  Psychiatric/Behavioral: Positive for agitation. Negative for behavioral problems, confusion and dysphoric mood. The patient is not nervous/anxious and is not hyperactive.     Social History      She  reports that she quit smoking about 8 years ago. She has never used smokeless tobacco. She reports that she does not drink alcohol or use drugs.       Social History   Socioeconomic History  . Marital status: Married    Spouse name: Not on file  . Number of children: 2  . Years of education: Not on file  . Highest education level: Not on file  Occupational History  . Occupation: Full Time  Social Needs  . Financial resource strain: Not on file  . Food insecurity:    Worry: Not on file    Inability: Not on file  . Transportation needs:    Medical: Not on file    Non-medical: Not on file  Tobacco Use  . Smoking status: Former Smoker    Last attempt to quit: 09/09/2009    Years since quitting: 8.0  . Smokeless tobacco: Never Used  Substance and Sexual Activity  . Alcohol use: No  . Drug use: No  . Sexual activity: Not on file  Lifestyle  . Physical activity:    Days per week: Not on file    Minutes per session: Not on file  . Stress: Not on file  Relationships  . Social connections:    Talks on phone: Not on file    Gets together: Not on file    Attends religious service: Not on file    Active member of club or organization: Not on file    Attends meetings of clubs or organizations: Not on file    Relationship status: Not on file  Other  Topics Concern  . Not on file  Social History Narrative  . Not on file    No past medical history on file.   Patient Active Problem List   Diagnosis Date Noted  . Segmental colitis with rectal bleeding (HCC) 11/03/2015  . Dermatitis, eczematoid 02/04/2015  . Dizziness 02/04/2015  . Borderline diabetes 02/04/2015  . 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 Avitaminosis D 02/04/2015  . BP (high blood pressure) 01/14/2015  . Insomnia 08/03/2014  . Muscle ache 06/21/2009  . Enthesopathy 01/27/2008  . Dermatologic disease 07/08/2007  . Hypercholesterolemia without hypertriglyceridemia 07/08/2007  . History of migraine 12/02/1997  . Allergic rhinitis 03/27/1996  . 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 FRACTURE SURGERY  03/28/12   wrist 4 pins, 10 screws   . KNEE ARTHROSCOPY Left 08/2014   [facility]  . VAGINAL HYSTERECTOMY  03/07/1998    Family History        Family Status  Relation Name Status  . Father  Deceased at age 53       lung cancer  . Brother  Deceased at  age 53       stage 4 colon cancer at age 43  . Brother  Alive       heart attack at 37  . Mother  Deceased at age 56       leukemia  . Brother  Deceased at age 53 weeks  . Brother  Alive        Her family history includes AAA (abdominal aortic aneurysm) in her brother; Cancer in her father; Colon cancer in her brother; Diabetes in her brother; Heart attack (age of onset: 58) in her brother; Hypertension in her brother, brother, and father; Leukemia in her mother; Lung cancer in her father; Mental illness in her father; Obesity in her brother and brother; Stroke (age of onset: Early 63's) in her brother.      Allergies  Allergen Reactions  . Maxalt  [Rizatriptan Benzoate] Swelling    Throat swelling.  . Cephalosporins   . Codeine   . Erythromycin   . Ketorolac Tromethamine   . Penicillins   . Simvastatin Swelling    Other reaction(s):  Muscle Pain  . Sulfa Antibiotics   . Tetanus Toxoid     Other reaction(s): Swelling     Current Outpatient Medications:  .  cyclobenzaprine (FLEXERIL) 5 MG tablet, TAKE 1 TABLET(5 MG) BY MOUTH THREE TIMES DAILY AS NEEDED FOR MUSCLE SPASMS, Disp: 20 tablet, Rfl: 5 .  levETIRAcetam (KEPPRA) 250 MG tablet, TAKE 3 TABLETS BY MOUTH EVERY NIGHT AT BEDTIME, Disp: 90 tablet, Rfl: 12 .  lisinopril (PRINIVIL,ZESTRIL) 10 MG tablet, TAKE 1 TABLET BY MOUTH EVERY DAY, Disp: 30 tablet, Rfl: 11 .  loratadine (CLARITIN) 10 MG tablet, Take 1 tablet by mouth daily., Disp: , Rfl:  .  pravastatin (PRAVACHOL) 40 MG tablet, TAKE 1 TABLET BY MOUTH EVERY NIGHT AT BEDTIME, Disp: 30 tablet, Rfl: 11 .  propranolol (INDERAL) 40 MG tablet, TAKE 1 TABLET BY MOUTH TWICE DAILY, Disp: 60 tablet, Rfl: 11 .  QUEtiapine (SEROQUEL) 25 MG tablet, TAKE 1 TABLET BY MOUTH EVERY NIGHT AT BEDTIME, Disp: 90 tablet, Rfl: 4 .  ondansetron (ZOFRAN ODT) 4 MG disintegrating tablet, Allow 1-2 tablets to dissolve in your mouth every 8 hours as needed for nausea/vomiting (Patient not taking: Reported on 10/09/2017), Disp: 30 tablet, Rfl: 0   Patient Care Team: [name], MD as PCP - General (Family Medicine)      Objective:   Vitals: BP 102/69 (BP Location: Left Arm, Patient Position: Sitting, Cuff Size: Large)   Pulse 67   Temp 98.4 F (36.9 C) (Oral)   Resp 16   Ht 5\' 4"  (1.626 m)   Wt 169 lb (76.7 kg)   SpO2 97% Comment: room air  BMI 29.01 kg/m    Vitals:   10/09/17 1402  BP: 102/69  Pulse: 67  Resp: 16  Temp: 98.4 F (36.9 C)  TempSrc: Oral  SpO2: 97%  Weight: 169 lb (76.7 kg)  Height: 5\' 4"  (1.626 m)     Physical Exam   General Appearance:    Alert, cooperative, no distress, appears stated age  Head:    Normocephalic, without obvious abnormality, atraumatic  Eyes:    PERRL, conjunctiva/corneas clear, EOM's intact, fundi    benign, both eyes  Ears:    Normal TM's and external ear canals, both ears    Nose:   Nares normal, septum midline, mucosa normal, no drainage    or sinus tenderness  Throat:   Lips, mucosa, and tongue normal; teeth  and gums normal  Neck:   Supple, symmetrical, trachea midline, no adenopathy;    thyroid:  no enlargement/tenderness/nodules; no carotid   bruit or JVD  Back:     Symmetric, no curvature, ROM normal, no CVA tenderness  Lungs:     Clear to auscultation bilaterally, respirations unlabored  Chest Wall:    No tenderness or deformity   Heart:    Regular rate and rhythm, S1 and S2 normal, no murmur, rub   or gallop  Breast Exam:    normal appearance, no masses or tenderness  Abdomen:     Soft, non-tender, bowel sounds active all four quadrants,    no masses, no organomegaly  Pelvic:    deferred  Extremities:   Extremities normal, atraumatic, no cyanosis or edema. Tender along extensor tendon of right proximal phalynx of thumb. No swelling. FROM of MCP.   Pulses:   2+ and symmetric all extremities  Skin:   Skin color, texture, turgor normal, no rashes or lesions  Lymph nodes:   Cervical, supraclavicular, and axillary nodes normal  Neurologic:   CNII-XII intact, normal strength, sensation and reflexes    throughout    Depression Screen PHQ 2/9 Scores 10/09/2017 11/05/2016  PHQ - 2 Score 0 0  PHQ- 9 Score 8 -      Assessment & Plan:     Routine Health Maintenance and Physical Exam  Exercise Activities and Dietary recommendations Goals   None     Immunization History  Administered Date(s) Administered  . Influenza,inj,Quad PF,6+ Mos 02/07/2015, 11/05/2016    Health Maintenance  Topic Date Due  . HIV Screening  04/23/1979  . PAP SMEAR  02/16/2012  . COLONOSCOPY  08/10/2017  . INFLUENZA VACCINE  08/22/2017  . MAMMOGRAM  11/10/2018  . TETANUS/TDAP  Discontinued     Discussed health benefits of physical activity, and encouraged her to engage in regular exercise appropriate for her age and condition.     --------------------------------------------------------------------  1. Annual physical exam Generally doing well.   2. Breast cancer screening Breast exam done. She has contact information to schedule mammogram which she plans to do.   3. Essential hypertension Doing well on lisinopril and propranolol.  - Comprehensive metabolic panel  4. Borderline diabetes  - Hemoglobin A1c  5. Avitaminosis D  - VITAMIN D 25 Hydroxy (Vit-D Deficiency, Fractures)  6. Hypercholesterolemia without hypertriglyceridemia She is tolerating pravastatin well with no adverse effects.   - Comprehensive metabolic panel - CBC - Lipid panel  7. Need for influenza vaccination  - Flu Vaccine QUAD 6+ mos PF IM (Fluarix Quad PF)  8. Tenosynovitis, de Quervain Naprosyn 500mg  bid for 1-2 weeks. Consider orthorpedic referral if not improving steadily.   9. Colon cancer screening  - Ambulatory referral to Gastroenterology   10. History of Migraine headaches.  Well controlled on current dose of Keppra  11. Insomnia Well controlled with Seroquel.    Mila Merry, MD  [facility]

## 2017-10-11 LAB — COMPREHENSIVE METABOLIC PANEL
ALT: 18 IU/L (ref 0–32)
AST: 18 IU/L (ref 0–40)
Albumin/Globulin Ratio: 2 (ref 1.2–2.2)
Albumin: 4.1 g/dL (ref 3.5–5.5)
Alkaline Phosphatase: 100 IU/L (ref 39–117)
BUN/Creatinine Ratio: 22 (ref 9–23)
BUN: 15 mg/dL (ref 6–24)
Bilirubin Total: 0.5 mg/dL (ref 0.0–1.2)
CO2: 24 mmol/L (ref 20–29)
Calcium: 9.1 mg/dL (ref 8.7–10.2)
Chloride: 102 mmol/L (ref 96–106)
Creatinine, Ser: 0.67 mg/dL (ref 0.57–1.00)
GFR calc Af Amer: 116 mL/min/{1.73_m2} (ref >59)
GFR calc non Af Amer: 101 mL/min/{1.73_m2} (ref >59)
Globulin, Total: 2.1 g/dL (ref 1.5–4.5)
Glucose: 89 mg/dL (ref 65–99)
Potassium: 4.4 mmol/L (ref 3.5–5.2)
Sodium: 141 mmol/L (ref 134–144)
Total Protein: 6.2 g/dL (ref 6.0–8.5)

## 2017-10-11 LAB — CBC
Hematocrit: 40.1 % (ref 34.0–46.6)
Hemoglobin: 13.3 g/dL (ref 11.1–15.9)
MCH: 29.9 pg (ref 26.6–33.0)
MCHC: 33.2 g/dL (ref 31.5–35.7)
MCV: 90 fL (ref 79–97)
Platelets: 303 10*3/uL (ref 150–450)
RBC: 4.45 x10E6/uL (ref 3.77–5.28)
RDW: 12.5 % (ref 12.3–15.4)
WBC: 7.1 10*3/uL (ref 3.4–10.8)

## 2017-10-11 LAB — HEMOGLOBIN A1C
Est. average glucose Bld gHb Est-mCnc: 117 mg/dL
Hgb A1c MFr Bld: 5.7 % — ABNORMAL HIGH (ref 4.8–5.6)

## 2017-10-11 LAB — VITAMIN D 25 HYDROXY (VIT D DEFICIENCY, FRACTURES): Vit D, 25-Hydroxy: 40 ng/mL (ref 30.0–100.0)

## 2017-10-11 LAB — LIPID PANEL
Chol/HDL Ratio: 3 ratio (ref 0.0–4.4)
Cholesterol, Total: 140 mg/dL (ref 100–199)
HDL: 46 mg/dL (ref >39)
LDL Calculated: 75 mg/dL (ref 0–99)
Triglycerides: 95 mg/dL (ref 0–149)
VLDL Cholesterol Cal: 19 mg/dL (ref 5–40)

## 2017-10-22 ENCOUNTER — Other Ambulatory Visit: Payer: Self-pay

## 2017-10-22 ENCOUNTER — Other Ambulatory Visit: Payer: Self-pay | Admitting: Family Medicine

## 2017-10-22 DIAGNOSIS — Z1211 Encounter for screening for malignant neoplasm of colon: Secondary | ICD-10-CM

## 2017-10-30 ENCOUNTER — Encounter: Payer: Self-pay | Admitting: Emergency Medicine

## 2017-10-30 MED ORDER — SODIUM CHLORIDE 0.9 % IV SOLN: INTRAVENOUS | Status: DC

## 2017-10-31 ENCOUNTER — Ambulatory Visit: Payer: BLUE CROSS/BLUE SHIELD | Admitting: Anesthesiology

## 2017-10-31 ENCOUNTER — Ambulatory Visit
Admission: RE | Admit: 2017-10-31 | Discharge: 2017-10-31 | Disposition: A | Discharge status: Home / Self Care | Payer: BLUE CROSS/BLUE SHIELD | Source: Ambulatory Visit | Attending: Gastroenterology | Admitting: Gastroenterology

## 2017-10-31 ENCOUNTER — Encounter
Admission: RE | Disposition: A | Discharge status: Home / Self Care | Payer: Self-pay | Source: Ambulatory Visit | Attending: Gastroenterology

## 2017-10-31 ENCOUNTER — Encounter: Payer: Self-pay | Admitting: *Deleted

## 2017-10-31 DIAGNOSIS — I10 Essential (primary) hypertension: Secondary | ICD-10-CM | POA: Diagnosis not present

## 2017-10-31 DIAGNOSIS — Z87891 Personal history of nicotine dependence: Secondary | ICD-10-CM | POA: Diagnosis not present

## 2017-10-31 DIAGNOSIS — Z86010 Personal history of colonic polyps: Secondary | ICD-10-CM | POA: Diagnosis not present

## 2017-10-31 DIAGNOSIS — Z79899 Other long term (current) drug therapy: Secondary | ICD-10-CM | POA: Insufficient documentation

## 2017-10-31 DIAGNOSIS — K644 Residual hemorrhoidal skin tags: Secondary | ICD-10-CM | POA: Insufficient documentation

## 2017-10-31 DIAGNOSIS — K640 First degree hemorrhoids: Secondary | ICD-10-CM | POA: Diagnosis not present

## 2017-10-31 DIAGNOSIS — Z1211 Encounter for screening for malignant neoplasm of colon: Secondary | ICD-10-CM

## 2017-10-31 HISTORY — PX: COLONOSCOPY WITH PROPOFOL: SHX5780

## 2017-10-31 SURGERY — COLONOSCOPY WITH PROPOFOL
Anesthesia: General

## 2017-10-31 MED ORDER — SODIUM CHLORIDE 0.9 % IV SOLN: INTRAVENOUS | Status: DC

## 2017-10-31 MED ORDER — LIDOCAINE HCL (CARDIAC) PF 100 MG/5ML IV SOSY
PREFILLED_SYRINGE | INTRAVENOUS | Status: DC | PRN
  Administered 2017-10-31: 50 mg via INTRAVENOUS

## 2017-10-31 MED ORDER — PROPOFOL 10 MG/ML IV BOLUS
INTRAVENOUS | Status: DC | PRN
  Administered 2017-10-31: 30 mg via INTRAVENOUS
  Administered 2017-10-31: 70 mg via INTRAVENOUS

## 2017-10-31 MED ORDER — PROPOFOL 500 MG/50ML IV EMUL
INTRAVENOUS | Status: DC | PRN
  Administered 2017-10-31: 140 ug/kg/min via INTRAVENOUS

## 2017-10-31 NOTE — Anesthesia Post-op Follow-up Note (Signed)
Anesthesia QCDR form completed.        

## 2017-10-31 NOTE — Anesthesia Preprocedure Evaluation (Signed)
Anesthesia Evaluation  Patient identified by MRN, date of birth, ID band Patient awake    Reviewed: Allergy & Precautions, H&P , NPO status , Patient's Chart, lab work & pertinent test results, reviewed documented beta blocker date and time   History of Anesthesia Complications Negative for: history of anesthetic complications  Airway Mallampati: II  TM Distance: >3 FB Neck ROM: full    Dental  (+) Dental Advidsory Given, Teeth Intact   Pulmonary neg pulmonary ROS, former smoker,           Cardiovascular Exercise Tolerance: Good hypertension, (-) angina(-) CAD, (-) Past MI, (-) Cardiac Stents and (-) CABG (-) dysrhythmias (-) Valvular Problems/Murmurs     Neuro/Psych negative neurological ROS  negative psych ROS   GI/Hepatic negative GI ROS, Neg liver ROS,   Endo/Other  negative endocrine ROS  Renal/GU negative Renal ROS  negative genitourinary   Musculoskeletal   Abdominal   Peds  Hematology negative hematology ROS (+)   Anesthesia Other Findings History reviewed. No pertinent past medical history.   Reproductive/Obstetrics negative OB ROS                             Anesthesia Physical Anesthesia Plan  ASA: II  Anesthesia Plan: General   Post-op Pain Management:    Induction: Intravenous  PONV Risk Score and Plan: 3 and Propofol infusion and TIVA  Airway Management Planned: Natural Airway and Nasal Cannula  Additional Equipment:   Intra-op Plan:   Post-operative Plan:   Informed Consent: I have reviewed the patients History and Physical, chart, labs and discussed the procedure including the risks, benefits and alternatives for the proposed anesthesia with the patient or authorized representative who has indicated his/her understanding and acceptance.   Dental Advisory Given  Plan Discussed with: Anesthesiologist, CRNA and Surgeon  Anesthesia Plan Comments:          Anesthesia Quick Evaluation

## 2017-10-31 NOTE — Anesthesia Postprocedure Evaluation (Signed)
Anesthesia Post Note  [name]  Procedure(s) Performed: COLONOSCOPY WITH PROPOFOL (N/A )  Patient location during evaluation: Endoscopy Anesthesia Type: General Level of consciousness: awake and alert Pain management: pain level controlled Vital Signs Assessment: post-procedure vital signs reviewed and stable Respiratory status: spontaneous breathing, nonlabored ventilation, respiratory function stable and patient connected to nasal cannula oxygen Cardiovascular status: blood pressure returned to baseline and stable Postop Assessment: no apparent nausea or vomiting Anesthetic complications: no     Last Vitals:  Vitals:   10/31/17 0924 10/31/17 0934  BP: 126/81 134/73  Pulse: (!) 58 (!) 45  Resp: 15 11  Temp:    SpO2: 97% 100%    Last Pain:  Vitals:   10/31/17 0934  TempSrc:   PainSc: 0-No pain                 Lenard Simmer

## 2017-10-31 NOTE — H&P (Signed)
Angela Mood, MD [address], [place], [place], [address] [address], [address], [place], [place], [address] Phone: [phone]  Fax: [phone]  Primary Care Physician:  Angela Limes, MD   Pre-Procedure History & Physical: HPI:  Angela Woodward is a 52 y.o. female is here for an colonoscopy.   History reviewed. No pertinent past medical history.  Past Surgical History:  Procedure Laterality Date  . FRACTURE SURGERY  03/28/12   wrist 4 pins, 10 screws   . KNEE ARTHROSCOPY Left 08/2014   Dr. Thomasena Edis; [facility]  . VAGINAL HYSTERECTOMY  03/07/1998    Prior to Admission medications   Medication Sig Start Date End Date Taking? Authorizing Provider  cyclobenzaprine (FLEXERIL) 5 MG tablet TAKE 1 TABLET(5 MG) BY MOUTH THREE TIMES DAILY AS NEEDED FOR MUSCLE SPASMS 07/26/16  Yes Angela Limes, MD  levETIRAcetam (KEPPRA) 250 MG tablet TAKE 3 TABLETS BY MOUTH EVERY NIGHT AT BEDTIME 03/29/17  Yes Angela Limes, MD  lisinopril (PRINIVIL,ZESTRIL) 10 MG tablet TAKE 1 TABLET BY MOUTH EVERY DAY 11/29/16  Yes Angela Limes, MD  loratadine (CLARITIN) 10 MG tablet Take 1 tablet by mouth daily. 09/29/13  Yes [provider]  naproxen (NAPROSYN) 500 MG tablet TAKE 1 TABLET(500 MG) BY MOUTH TWICE DAILY WITH FOOD FOR UP TO 14 DAYS AS NEEDED 10/22/17  Yes Angela Limes, MD  pravastatin (PRAVACHOL) 40 MG tablet TAKE 1 TABLET BY MOUTH EVERY NIGHT AT BEDTIME 11/29/16  Yes Angela Limes, MD  propranolol (INDERAL) 40 MG tablet TAKE 1 TABLET BY MOUTH TWICE DAILY 11/29/16  Yes Angela Limes, MD  QUEtiapine (SEROQUEL) 25 MG tablet TAKE 1 TABLET BY MOUTH EVERY NIGHT AT BEDTIME 04/27/17  Yes Angela Limes, MD  ondansetron (ZOFRAN ODT) 4 MG disintegrating tablet Allow 1-2 tablets to dissolve in your mouth every 8 hours as needed for nausea/vomiting Patient not taking: Reported on 10/09/2017 06/24/16   Angela Rose, MD    Allergies as of 10/22/2017 - Review  Complete 10/09/2017  Allergen Reaction Noted  . Maxalt  [rizatriptan benzoate] Swelling 01/14/2015  . Cephalosporins  01/14/2015  . Codeine  01/14/2015  . Erythromycin  01/14/2015  . Ketorolac tromethamine  01/14/2015  . Penicillins  01/14/2015  . Simvastatin Swelling 01/14/2015  . Sulfa antibiotics  01/14/2015  . Tetanus toxoid  01/14/2015    Family History  Problem Relation Age of Onset  . Cancer Father   . Lung cancer Father   . Hypertension Father   . Mental illness Father   . Obesity Brother   . Hypertension Brother   . Colon cancer Brother   . Diabetes Brother   . Hypertension Brother   . Obesity Brother   . Stroke Brother Early 30's       paralyses of left side of body  . Heart attack Brother 32  . Leukemia Mother   . AAA (abdominal aortic aneurysm) Brother     Social History   Socioeconomic History  . Marital status: Married    Spouse name: Not on file  . Number of children: 2  . Years of education: Not on file  . Highest education level: Not on file  Occupational History  . Occupation: Full Time  Social Needs  . Financial resource strain: Not on file  . Food insecurity:    Worry: Not on file    Inability: Not on file  . Transportation needs:  Medical: Not on file    Non-medical: Not on file  Tobacco Use  . Smoking status: Former Smoker    Last attempt to quit: 09/09/2009    Years since quitting: 8.1  . Smokeless tobacco: Never Used  . Tobacco comment: 1ppd for 25 years  Substance and Sexual Activity  . Alcohol use: No  . Drug use: No  . Sexual activity: Not on file  Lifestyle  . Physical activity:    Days per week: Not on file    Minutes per session: Not on file  . Stress: Not on file  Relationships  . Social connections:    Talks on phone: Not on file    Gets together: Not on file    Attends religious service: Not on file    Active member of club or organization: Not on file    Attends meetings of clubs or organizations: Not on file     Relationship status: Not on file  . Intimate partner violence:    Fear of current or ex partner: Not on file    Emotionally abused: Not on file    Physically abused: Not on file    Forced sexual activity: Not on file  Other Topics Concern  . Not on file  Social History Narrative  . Not on file    Review of Systems: See HPI, otherwise negative ROS  Physical Exam: BP (!) 122/95   Pulse (!) 57   Temp (!) 95 F (35 C) (Tympanic)   Ht 5' 3.5" (1.613 m)   Wt 73.5 kg   SpO2 100%   BMI 28.25 kg/m  General:   Alert,  pleasant and cooperative in NAD Head:  Normocephalic and atraumatic. Neck:  Supple; no masses or thyromegaly. Lungs:  Clear throughout to auscultation, normal respiratory effort.    Heart:  +S1, +S2, Regular rate and rhythm, No edema. Abdomen:  Soft, nontender and nondistended. Normal bowel sounds, without guarding, and without rebound.   Neurologic:  Alert and  oriented x4;  grossly normal neurologically.  Impression/Plan: Angela Woodward is here for an colonoscopy to be performed for surveillance due to prior history of colon polyps   Risks, benefits, limitations, and alternatives regarding  colonoscopy have been reviewed with the patient.  Questions have been answered.  All parties agreeable.   Angela Mood, MD  10/31/2017, 8:34 AM

## 2017-10-31 NOTE — Op Note (Signed)
[facility] Patient Name: Angela Woodward Procedure Date: 10/31/2017 8:36 AM MRN: [id number] Account #: [id number] Date of Birth: [birth year] Admit Type: Outpatient Age: 52 Room: [facility] ENDO ROOM 4 Gender: Female Note Status: Finalized Procedure:            Colonoscopy Indications:          High risk colon cancer surveillance: Personal history                        of colonic polyps Providers:            Wyline Mood MD, MD Referring MD:         Demetrios Isaacs. Sherrie Mustache, MD (Referring MD) Medicines:            Monitored Anesthesia Care Complications:        No immediate complications. Procedure:            Pre-Anesthesia Assessment:                       - Prior to the procedure, a History and Physical was                        performed, and patient medications, allergies and                        sensitivities were reviewed. The patient's tolerance of                        previous anesthesia was reviewed.                       - The risks and benefits of the procedure and the                        sedation options and risks were discussed with the                        patient. All questions were answered and informed                        consent was obtained.                       - ASA Grade Assessment: II - A patient with mild                        systemic disease.                       After obtaining informed consent, the colonoscope was                        passed under direct vision. Throughout the procedure,                        the patient's blood pressure, pulse, and oxygen                        saturations were monitored continuously. The  Colonoscope was introduced through the anus and                        advanced to the the cecum, identified by the                        appendiceal orifice, IC valve and transillumination.                        The colonoscopy was performed with ease. The patient                   tolerated the procedure well. Findings:      The entire examined colon appeared normal on direct and retroflexion       views.      Skin tags were found on perianal exam.      Non-bleeding internal hemorrhoids were found during retroflexion. The       hemorrhoids were medium-sized and Grade I (internal hemorrhoids that do       not prolapse). Impression:           - The entire examined colon is normal on direct and                        retroflexion views.                       - No specimens collected. Recommendation:       - Discharge patient to home (with escort).                       - Resume previous diet.                       - Continue present medications.                       - Repeat colonoscopy in 5 years for surveillance. Procedure Code(s):    --- Professional ---                       [phone], Colonoscopy, flexible; diagnostic, including                        collection of specimen(s) by brushing or washing, when                        performed (separate procedure) Diagnosis Code(s):    --- Professional ---                       Z86.010, Personal history of colonic polyps CPT copyright 2018 American Medical Association. All rights reserved. The codes documented in this report are preliminary and upon coder review may  be revised to meet current compliance requirements. Wyline Mood, MD Wyline Mood MD, MD 10/31/2017 9:04:04 AM This report has been signed electronically. Number of Addenda: 0 Note Initiated On: 10/31/2017 8:36 AM Scope Withdrawal Time: 0 hours 9 minutes 55 seconds  Total Procedure Duration: 0 hours 12 minutes 4 seconds       [facility]

## 2017-10-31 NOTE — Transfer of Care (Signed)
Immediate Anesthesia Transfer of Care Note  Patient: Camille Dragan Cawthorn  Procedure(s) Performed: COLONOSCOPY WITH PROPOFOL (N/A )  Patient Location: PACU  Anesthesia Type:General  Level of Consciousness: awake and responds to stimulation  Airway & Oxygen Therapy: Patient Spontanous Breathing and Patient connected to nasal cannula oxygen  Post-op Assessment: Report given to RN and Post -op Vital signs reviewed and stable  Post vital signs: Reviewed and stable  Last Vitals:  Vitals Value Taken Time  BP 96/72 10/31/2017  9:05 AM  Temp    Pulse 81 10/31/2017  9:05 AM  Resp 13 10/31/2017  9:05 AM  SpO2 97 % 10/31/2017  9:05 AM    Last Pain:  Vitals:   10/31/17 0904  TempSrc: (P) Tympanic  PainSc:          Complications: No apparent anesthesia complications

## 2017-11-01 ENCOUNTER — Encounter: Payer: Self-pay | Admitting: Gastroenterology

## 2017-11-11 ENCOUNTER — Ambulatory Visit
Admission: RE | Admit: 2017-11-11 | Discharge: 2017-11-11 | Disposition: A | Discharge status: Home / Self Care | Payer: BLUE CROSS/BLUE SHIELD | Source: Ambulatory Visit | Attending: Family Medicine | Admitting: Family Medicine

## 2017-11-11 DIAGNOSIS — Z1231 Encounter for screening mammogram for malignant neoplasm of breast: Secondary | ICD-10-CM | POA: Insufficient documentation

## 2017-11-19 ENCOUNTER — Other Ambulatory Visit: Payer: Self-pay | Admitting: Family Medicine

## 2017-11-19 DIAGNOSIS — I10 Essential (primary) hypertension: Secondary | ICD-10-CM

## 2017-11-21 ENCOUNTER — Other Ambulatory Visit: Payer: Self-pay | Admitting: Family Medicine

## 2018-03-10 ENCOUNTER — Ambulatory Visit
Admission: RE | Admit: 2018-03-10 | Discharge: 2018-03-10 | Disposition: A | Discharge status: Home / Self Care | Payer: BLUE CROSS/BLUE SHIELD | Attending: Family Medicine | Admitting: Family Medicine

## 2018-03-10 ENCOUNTER — Ambulatory Visit: Payer: BLUE CROSS/BLUE SHIELD | Admitting: Family Medicine

## 2018-03-10 ENCOUNTER — Ambulatory Visit
Admission: RE | Admit: 2018-03-10 | Discharge: 2018-03-10 | Disposition: A | Discharge status: Home / Self Care | Payer: BLUE CROSS/BLUE SHIELD | Source: Ambulatory Visit | Attending: Family Medicine | Admitting: Family Medicine

## 2018-03-10 ENCOUNTER — Encounter: Payer: Self-pay | Admitting: Family Medicine

## 2018-03-10 VITALS — BP 148/91 | HR 62 | Temp 97.9°F | Wt 179.0 lb

## 2018-03-10 DIAGNOSIS — M542 Cervicalgia: Secondary | ICD-10-CM

## 2018-03-10 DIAGNOSIS — M2550 Pain in unspecified joint: Secondary | ICD-10-CM | POA: Diagnosis not present

## 2018-03-10 DIAGNOSIS — G8929 Other chronic pain: Secondary | ICD-10-CM | POA: Insufficient documentation

## 2018-03-10 DIAGNOSIS — M545 Low back pain, unspecified: Secondary | ICD-10-CM

## 2018-03-10 DIAGNOSIS — M549 Dorsalgia, unspecified: Secondary | ICD-10-CM | POA: Insufficient documentation

## 2018-03-10 DIAGNOSIS — R5383 Other fatigue: Secondary | ICD-10-CM | POA: Diagnosis not present

## 2018-03-10 NOTE — Progress Notes (Signed)
[name] Female    DOB: 1965-11-01   53 y.o.   MRN: [id number] Visit Date: 03/10/2018  Today's Provider: Mila Merry, MD   Chief Complaint  Patient presents with  . Arthritis   Subjective:     Arthritis  Presents for initial visit. The disease course has been worsening. She complains of pain, stiffness and joint swelling. Associated symptoms include fatigue. Pertinent negatives include no fever. Her past medical history is significant for osteoarthritis. Her family medical history includes family history of chronic back pain and family history of rheumatoid arthritis (Mom had RA ). Past treatments include NSAIDs and OTC med. The treatment provided no relief. Factors aggravating her arthritis include gripping and lifting.  States she has been having pain in her neck for several months that radiates down her left arm and makes her hand tingle. Pain in back does not radiate, and is mostly in the area of the SI joints. Feels very stiff for a few hours when she gets up the morning. Feels very fatigued from pain. States is sleeping fine.   Lab Results  Component Value Date   WBC 7.1 10/10/2017   HGB 13.3 10/10/2017   HCT 40.1 10/10/2017   MCV 90 10/10/2017   PLT 303 10/10/2017   BMET    Component Value Date/Time   NA 141 10/10/2017 0805   K 4.4 10/10/2017 0805   CL 102 10/10/2017 0805   CO2 24 10/10/2017 0805   GLUCOSE 89 10/10/2017 0805   GLUCOSE 98 11/06/2016 0809   BUN 15 10/10/2017 0805   CREATININE 0.67 10/10/2017 0805   CREATININE 0.80 11/06/2016 0809   CALCIUM 9.1 10/10/2017 0805   GFRNONAA 101 10/10/2017 0805   GFRNONAA 85 11/06/2016 0809   GFRAA 116 10/10/2017 0805   GFRAA 98 11/06/2016 0809    Allergies  Allergen Reactions  . Maxalt  [Rizatriptan Benzoate] Swelling    Throat swelling.  . Cephalosporins   . Codeine   . Erythromycin   . Ketorolac Tromethamine   . Penicillins   . Simvastatin Swelling    Other reaction(s): Muscle Pain  .  Sulfa Antibiotics   . Tetanus Toxoid     Other reaction(s): Swelling     Current Outpatient Medications:  .  levETIRAcetam (KEPPRA) 250 MG tablet, TAKE 3 TABLETS BY MOUTH EVERY NIGHT AT BEDTIME, Disp: 90 tablet, Rfl: 12 .  lisinopril (PRINIVIL,ZESTRIL) 10 MG tablet, TAKE 1 TABLET BY MOUTH EVERY DAY, Disp: 30 tablet, Rfl: 11 .  loratadine (CLARITIN) 10 MG tablet, Take 1 tablet by mouth daily., Disp: , Rfl:  .  naproxen (NAPROSYN) 500 MG tablet, TAKE 1 TABLET(500 MG) BY MOUTH TWICE DAILY WITH FOOD FOR UP TO 14 DAYS AS NEEDED, Disp: 30 tablet, Rfl: 5 .  ondansetron (ZOFRAN ODT) 4 MG disintegrating tablet, Allow 1-2 tablets to dissolve in your mouth every 8 hours as needed for nausea/vomiting, Disp: 30 tablet, Rfl: 0 .  pravastatin (PRAVACHOL) 40 MG tablet, TAKE 1 TABLET BY MOUTH EVERY NIGHT AT BEDTIME, Disp: 30 tablet, Rfl: 11 .  propranolol (INDERAL) 40 MG tablet, TAKE 1 TABLET BY MOUTH TWICE DAILY, Disp: 60 tablet, Rfl: 11 .  QUEtiapine (SEROQUEL) 25 MG tablet, TAKE 1 TABLET BY MOUTH EVERY NIGHT AT BEDTIME, Disp: 90 tablet, Rfl: 4 .  cyclobenzaprine (FLEXERIL) 5 MG tablet, TAKE 1 TABLET(5 MG) BY MOUTH THREE TIMES DAILY AS NEEDED FOR MUSCLE SPASMS (Patient not taking: Reported on 03/10/2018), Disp: 20 tablet, Rfl: 5  Review of Systems  Constitutional: Positive for fatigue. Negative for activity change, appetite change, chills, diaphoresis, fever and unexpected weight change.  Musculoskeletal: Positive for arthralgias, arthritis, back pain, joint swelling and stiffness. Negative for gait problem, myalgias, neck pain and neck stiffness.    Social History   Tobacco Use  . Smoking status: Former Smoker    Last attempt to quit: 09/09/2009    Years since quitting: 8.5  . Smokeless tobacco: Never Used  . Tobacco comment: 1ppd for 25 years  Substance Use Topics  . Alcohol use: No   .   Objective:   BP (!) 148/91 (BP Location: Right Arm, Patient Position: Sitting, Cuff Size: Normal)   Pulse  62   Temp 97.9 F (36.6 C) (Oral)   Wt 179 lb (81.2 kg)   BMI 31.21 kg/m  Vitals:   03/10/18 0805  BP: (!) 148/91  Pulse: 62  Temp: 97.9 F (36.6 C)  TempSrc: Oral  Weight: 179 lb (81.2 kg)     Physical Exam  General Appearance:    Alert, cooperative, no distress, overweight  Eyes:    PERRL, conjunctiva/corneas clear, EOM's intact       Lungs:     Clear to auscultation bilaterally, respirations unlabored  Heart:    Regular rate and rhythm  Neurologic:   Awake, alert, oriented x 3. No apparent focal neurological           defect.   MS:   Slight tenderness base of cervical and upper thoracic spine. Mild tenderness around ips of left first, second, and third digits.        Assessment & Plan    1. Polyarthralgia  - ANA w/Reflex if Positive - CYCLIC CITRUL PEPTIDE ANTIBODY, IGG/IGA - Rheumatoid Factor - Uric acid  2. Neck pain  - DG Cervical Spine Complete; Future  3. Chronic bilateral low back pain without sciatica  - DG Lumbar Spine Complete; Future  4. Other fatigue  - TSH     Mila Merry, MD  [facility]

## 2018-03-10 NOTE — Patient Instructions (Signed)
.   Please review the attached list of medications and notify my office if there are any errors.   Please go to the lab draw station in [address] on the second floor of [facility]. Normal hours are 8:00am to 12:30pm and 1:30pm to 4:00pm Monday through Friday  Go to the [facility] on [facility] for neck and low back Xray

## 2018-03-12 ENCOUNTER — Telehealth: Payer: Self-pay

## 2018-03-12 ENCOUNTER — Encounter: Payer: Self-pay | Admitting: Family Medicine

## 2018-03-12 DIAGNOSIS — M419 Scoliosis, unspecified: Secondary | ICD-10-CM | POA: Insufficient documentation

## 2018-03-12 DIAGNOSIS — M5030 Other cervical disc degeneration, unspecified cervical region: Secondary | ICD-10-CM | POA: Insufficient documentation

## 2018-03-12 LAB — RHEUMATOID FACTOR: Rhuematoid fact SerPl-aCnc: <10 IU/mL (ref 0.0–13.9)

## 2018-03-12 LAB — CYCLIC CITRUL PEPTIDE ANTIBODY, IGG/IGA: Cyclic Citrullin Peptide Ab: 8 units (ref 0–19)

## 2018-03-12 LAB — URIC ACID: Uric Acid: 4.5 mg/dL (ref 2.5–7.1)

## 2018-03-12 LAB — TSH: TSH: 1.92 u[IU]/mL (ref 0.450–4.500)

## 2018-03-12 LAB — ANA W/REFLEX IF POSITIVE: Anti Nuclear Antibody(ANA): NEGATIVE

## 2018-03-12 MED ORDER — PREDNISONE 10 MG PO TABS: ORAL_TABLET | ORAL | 0 refills | Status: AC

## 2018-03-12 NOTE — Telephone Encounter (Signed)
Pt advised RX sent to [facility] in [place].  Thanks,   -Laura  

## 2018-03-12 NOTE — Telephone Encounter (Signed)
-----   Message from [name], MD sent at 03/12/2018  8:04 AM EST ----- xrays shows she has scoliosis of lumbar spine and disk degeneration and spurring in cervical spine, which is probably pushing against nerve going down arm. Tests for RHEUMATOID arthritis are negative.  Recommend 12 day course of prednisone. If tingling in arm does not resolve on prednisone then will need referral to spine specialist.

## 2018-04-16 ENCOUNTER — Other Ambulatory Visit: Payer: Self-pay | Admitting: Family Medicine

## 2018-05-14 ENCOUNTER — Other Ambulatory Visit: Payer: Self-pay | Admitting: Family Medicine

## 2018-06-18 ENCOUNTER — Other Ambulatory Visit: Payer: Self-pay | Admitting: Family Medicine

## 2018-09-11 ENCOUNTER — Other Ambulatory Visit: Payer: Self-pay | Admitting: Family Medicine

## 2018-09-11 DIAGNOSIS — I10 Essential (primary) hypertension: Secondary | ICD-10-CM

## 2018-11-07 ENCOUNTER — Other Ambulatory Visit: Payer: Self-pay | Admitting: Family Medicine

## 2019-02-09 ENCOUNTER — Other Ambulatory Visit: Payer: Self-pay | Admitting: Family Medicine

## 2019-02-09 DIAGNOSIS — I10 Essential (primary) hypertension: Secondary | ICD-10-CM

## 2019-03-02 ENCOUNTER — Other Ambulatory Visit: Payer: Self-pay | Admitting: Family Medicine

## 2019-03-02 DIAGNOSIS — I10 Essential (primary) hypertension: Secondary | ICD-10-CM

## 2019-03-02 NOTE — Telephone Encounter (Signed)
Requested medication (s) are due for refill today: yes  Requested medication (s) are on the active medication list: yes  Last refill:  02/10/19  Future visit scheduled: no  Notes to clinic:  no valid encounter within last 6 months    Requested Prescriptions  Pending Prescriptions Disp Refills   lisinopril (ZESTRIL) 10 MG tablet [Pharmacy Med Name: LISINOPRIL 10MG  TABLETS] 60 tablet 3    Sig: TAKE 1 TABLET BY MOUTH EVERY DAY      Cardiovascular:  ACE Inhibitors Failed - 03/02/2019  8:00 AM      Failed - Cr in normal range and within 180 days    Creat  Date Value Ref Range Status  11/06/2016 0.80 0.50 - 1.05 mg/dL Final    Comment:    For patients >23 years of age, the reference limit for Creatinine is approximately 13% higher for people identified as African-American. .    Creatinine, Ser  Date Value Ref Range Status  10/10/2017 0.67 0.57 - 1.00 mg/dL Final          Failed - K in normal range and within 180 days    Potassium  Date Value Ref Range Status  10/10/2017 4.4 3.5 - 5.2 mmol/L Final          Failed - Last BP in normal range    BP Readings from Last 1 Encounters:  03/10/18 (!) 148/91          Failed - Valid encounter within last 6 months    Recent Outpatient Visits           11 months ago Polyarthralgia   [facility], [place]   1 year ago Annual physical exam   [facility], MD   2 years ago Essential hypertension   [facility], MD   3 years ago Allergic reaction to drug, initial encounter   [facility], MD   3 years ago Segmental colitis with rectal bleeding [facility])   [facility], MD              Passed - Patient is not pregnant

## 2019-03-03 NOTE — Progress Notes (Signed)
[name] Female    DOB: 1966/01/08   54 y.o.   MRN: [id number] Visit Date: 03/04/2019  Today's Provider: Mila Merry, MD   Chief Complaint  Patient presents with  . Hypertension  . Hyperlipidemia  . Hyperglycemia   Subjective:     HPI  Hypertension, follow-up:  BP Readings from Last 3 Encounters:  03/04/19 (!) 144/84  03/10/18 (!) 148/91  10/31/17 134/73    She was last seen for hypertension more than 1 year ago.  BP at that visit was 134/73. Management since that visit includes no changes. She reports good compliance with treatment. She is not having side effects.  She is exercising. She is adherent to low salt diet.   Outside blood pressures are checked occasionally. She is experiencing none.  Patient denies chest pain, chest pressure/discomfort, claudication, dyspnea, exertional chest pressure/discomfort, fatigue, irregular heart beat, lower extremity edema, near-syncope, orthopnea, palpitations, paroxysmal nocturnal dyspnea, syncope and tachypnea.   Cardiovascular risk factors include dyslipidemia and hypertension.  Use of agents associated with hypertension: none.     Weight trend: fluctuating a bit Wt Readings from Last 3 Encounters:  03/04/19 186 lb (84.4 kg)  03/10/18 179 lb (81.2 kg)  10/31/17 162 lb (73.5 kg)    Current diet: well balanced  ------------------------------------------------------------------------  Lipid/Cholesterol, Follow-up:   Last seen for this more than 1 year ago.  Management changes since that visit include none. . Last Lipid Panel:    Component Value Date/Time   CHOL 140 10/10/2017 0805   TRIG 95 10/10/2017 0805   HDL 46 10/10/2017 0805   CHOLHDL 3.0 10/10/2017 0805   CHOLHDL 3.7 11/06/2016 0809   LDLCALC 75 10/10/2017 0805   LDLCALC 84 11/06/2016 0809    Risk factors for vascular disease include hypercholesterolemia and hypertension  She reports good compliance with treatment. She is not  having side effects.  Current symptoms include none and have been stable. Weight trend: fluctuating a bit Prior visit with dietician: no Current diet: well balanced Current exercise: cardiovascular workout on exercise equipment  Wt Readings from Last 3 Encounters:  03/04/19 186 lb (84.4 kg)  03/10/18 179 lb (81.2 kg)  10/31/17 162 lb (73.5 kg)    -------------------------------------------------------------------  Hyperglycemia, Follow-up:   Lab Results  Component Value Date   HGBA1C 5.7 (H) 10/10/2017   HGBA1C 5.5 02/08/2015   GLUCOSE 89 10/10/2017   GLUCOSE 98 11/06/2016   GLUCOSE 112 (H) 06/24/2016    Last seen for for this more than 1 year ago.  Management since then includes no changes. Current symptoms include none and have been stable.  Weight trend: fluctuating a bit Prior visit with dietician: no Current diet: well balanced Current exercise: cardiovascular workout on exercise equipment  Pertinent Labs:    Component Value Date/Time   CHOL 140 10/10/2017 0805   TRIG 95 10/10/2017 0805   CHOLHDL 3.0 10/10/2017 0805   CHOLHDL 3.7 11/06/2016 0809   CREATININE 0.67 10/10/2017 0805   CREATININE 0.80 11/06/2016 0809    Wt Readings from Last 3 Encounters:  03/04/19 186 lb (84.4 kg)  03/10/18 179 lb (81.2 kg)  10/31/17 162 lb (73.5 kg)    Allergies  Allergen Reactions  . Maxalt  [Rizatriptan Benzoate] Swelling    Throat swelling.  . Cephalosporins   . Codeine   . Erythromycin   . Ketorolac Tromethamine   . Penicillins   . Simvastatin Swelling    Other reaction(s): Muscle Pain  .  Sulfa Antibiotics   . Tetanus Toxoid     Other reaction(s): Swelling     Current Outpatient Medications:  .  levETIRAcetam (KEPPRA) 250 MG tablet, TAKE 3 TABLETS BY MOUTH EVERY NIGHT AT BEDTIME, Disp: 90 tablet, Rfl: 4 .  lisinopril (ZESTRIL) 10 MG tablet, TAKE 1 TABLET BY MOUTH EVERY DAY, Disp: 60 tablet, Rfl: 3 .  naproxen (NAPROSYN) 500 MG tablet, TAKE 1 TABLET(500 MG)  BY MOUTH TWICE DAILY WITH FOOD FOR UP TO 14 DAYS AS NEEDED, Disp: 30 tablet, Rfl: 5 .  pravastatin (PRAVACHOL) 40 MG tablet, TAKE 1 TABLET BY MOUTH EVERY NIGHT AT BEDTIME, Disp: 90 tablet, Rfl: 3 .  propranolol (INDERAL) 40 MG tablet, TAKE 1 TABLET BY MOUTH TWICE DAILY, Disp: 180 tablet, Rfl: 3 .  QUEtiapine (SEROQUEL) 25 MG tablet, TAKE 1 TABLET BY MOUTH EVERY NIGHT AT BEDTIME, Disp: 90 tablet, Rfl: 4  Review of Systems  Constitutional: Negative for appetite change, chills, fatigue and fever.  Respiratory: Negative for chest tightness and shortness of breath.   Cardiovascular: Negative for chest pain and palpitations.  Gastrointestinal: Negative for abdominal pain, nausea and vomiting.  Neurological: Negative for dizziness and weakness.    Social History   Tobacco Use  . Smoking status: Former Smoker    Quit date: 09/09/2009    Years since quitting: 9.4  . Smokeless tobacco: Never Used  . Tobacco comment: 1ppd for 25 years  Substance Use Topics  . Alcohol use: No      Objective:   BP (!) 144/84 (BP Location: Right Arm, Cuff Size: Large)   Pulse (!) 59   Temp (!) 96.2 F (35.7 C) (Temporal)   Resp 16   Wt 186 lb (84.4 kg)   SpO2 99% Comment: room air  BMI 32.43 kg/m  Vitals:   03/04/19 0811 03/04/19 0813  BP: 140/82 (!) 144/84  Pulse: (!) 59   Resp: 16   Temp: (!) 96.2 F (35.7 C)   TempSrc: Temporal   SpO2: 99%   Weight: 186 lb (84.4 kg)   Body mass index is 32.43 kg/m.   Physical Exam   General: Appearance:    Overweight female in no acute distress  Eyes:    PERRL, conjunctiva/corneas clear, EOM's intact       Lungs:     Clear to auscultation bilaterally, respirations unlabored  Heart:    Bradycardic. Normal rhythm. No murmurs, rubs, or gallops.   MS:   All extremities are intact.   Neurologic:   Awake, alert, oriented x 3. No apparent focal neurological           defect.            Assessment & Plan    1. History of migraine She would like to get  off some of her migraine preventions medications since she has not had one for may years. She would like to get rid of propranolol first. Considering this would likely result in increase in her BP will go ahead and change lisinopril as below. She is to reduce to one propranolol daily for 2-4 weeks, then stop, then may wean keppra and/or Seroquel by one tablet every 2-4 weeks as tolerated.   2. Avitaminosis D  - VITAMIN D 25 Hydroxy (Vit-D Deficiency, Fractures)  3. Borderline diabetes  - Hemoglobin A1c  4. Hypercholesterolemia without hypertriglyceridemia She is tolerating pravastatin well with no adverse effects.   - Comprehensive metabolic panel - Lipid panel - TSH  5. Essential hypertension Due to  weaning propranolol, will change lisinopril 10mg  to - lisinopril-hydrochlorothiazide (ZESTORETIC) 20-12.5 MG tablet; Take 1 tablet by mouth daily.  Dispense: 90 tablet; Refill: 1 - TSH     , MD  [facility]

## 2019-03-04 ENCOUNTER — Ambulatory Visit: Payer: BLUE CROSS/BLUE SHIELD | Admitting: Family Medicine

## 2019-03-04 ENCOUNTER — Encounter: Payer: Self-pay | Admitting: Family Medicine

## 2019-03-04 ENCOUNTER — Other Ambulatory Visit: Payer: Self-pay

## 2019-03-04 VITALS — BP 144/84 | HR 59 | Temp 96.2°F | Resp 16 | Wt 186.0 lb

## 2019-03-04 DIAGNOSIS — E559 Vitamin D deficiency, unspecified: Secondary | ICD-10-CM | POA: Diagnosis not present

## 2019-03-04 DIAGNOSIS — R7303 Prediabetes: Secondary | ICD-10-CM | POA: Diagnosis not present

## 2019-03-04 DIAGNOSIS — Z8669 Personal history of other diseases of the nervous system and sense organs: Secondary | ICD-10-CM | POA: Diagnosis not present

## 2019-03-04 DIAGNOSIS — E7800 Pure hypercholesterolemia, unspecified: Secondary | ICD-10-CM | POA: Diagnosis not present

## 2019-03-04 DIAGNOSIS — I10 Essential (primary) hypertension: Secondary | ICD-10-CM

## 2019-03-04 MED ORDER — LISINOPRIL-HYDROCHLOROTHIAZIDE 20-12.5 MG PO TABS: 1.0000 | ORAL_TABLET | Freq: Every day | ORAL | 1 refills | Status: DC

## 2019-03-04 NOTE — Patient Instructions (Signed)
.   Please review the attached list of medications and notify my office if there are any errors.   . Please bring all of your medications to every appointment so we can make sure that our medication list is the same as yours.    You can wean down or off of your migraine medications by cutting one daily pill every 2-4 weeks.

## 2019-03-05 LAB — LIPID PANEL
Chol/HDL Ratio: 3.4 ratio (ref 0.0–4.4)
Cholesterol, Total: 162 mg/dL (ref 100–199)
HDL: 47 mg/dL
LDL Chol Calc (NIH): 96 mg/dL (ref 0–99)
Triglycerides: 102 mg/dL (ref 0–149)
VLDL Cholesterol Cal: 19 mg/dL (ref 5–40)

## 2019-03-05 LAB — COMPREHENSIVE METABOLIC PANEL WITH GFR
ALT: 29 IU/L (ref 0–32)
AST: 24 IU/L (ref 0–40)
Albumin/Globulin Ratio: 2 (ref 1.2–2.2)
Albumin: 4.3 g/dL (ref 3.8–4.9)
Alkaline Phosphatase: 111 IU/L (ref 39–117)
BUN/Creatinine Ratio: 18 (ref 9–23)
BUN: 14 mg/dL (ref 6–24)
Bilirubin Total: 0.5 mg/dL (ref 0.0–1.2)
CO2: 23 mmol/L (ref 20–29)
Calcium: 9.5 mg/dL (ref 8.7–10.2)
Chloride: 103 mmol/L (ref 96–106)
Creatinine, Ser: 0.78 mg/dL (ref 0.57–1.00)
GFR calc Af Amer: 100 mL/min/1.73
GFR calc non Af Amer: 86 mL/min/1.73
Globulin, Total: 2.2 g/dL (ref 1.5–4.5)
Glucose: 95 mg/dL (ref 65–99)
Potassium: 4.5 mmol/L (ref 3.5–5.2)
Sodium: 141 mmol/L (ref 134–144)
Total Protein: 6.5 g/dL (ref 6.0–8.5)

## 2019-03-05 LAB — VITAMIN D 25 HYDROXY (VIT D DEFICIENCY, FRACTURES): Vit D, 25-Hydroxy: 52.5 ng/mL (ref 30.0–100.0)

## 2019-03-05 LAB — HEMOGLOBIN A1C
Est. average glucose Bld gHb Est-mCnc: 117 mg/dL
Hgb A1c MFr Bld: 5.7 % — ABNORMAL HIGH (ref 4.8–5.6)

## 2019-03-05 LAB — TSH: TSH: 1.72 u[IU]/mL (ref 0.450–4.500)

## 2019-06-05 ENCOUNTER — Other Ambulatory Visit: Payer: Self-pay | Admitting: Family Medicine

## 2019-06-05 MED ORDER — QUETIAPINE FUMARATE 25 MG PO TABS: 25.0000 mg | ORAL_TABLET | Freq: Every day | ORAL | 4 refills | Status: DC

## 2019-06-05 NOTE — Telephone Encounter (Signed)
Copied from CRM [id number]. Topic: Quick Communication - Rx Refill/Question >> Jun 05, 2019  8:15 AM Jaquita Rector A wrote: Medication: QUEtiapine (SEROQUEL) 25 MG tablet  Has the patient contacted their pharmacy? Yes.   (Agent: If no, request that the patient contact the pharmacy for the refill.) (Agent: If yes, when and what did the pharmacy advise?)  Preferred Pharmacy (with phone number or street name): [facility] [address] - [place], [place] - [address]  Phone:  [phone] Fax:  [phone]     Agent: Please be advised that RX refills may take up to 3 business days. We ask that you follow-up with your pharmacy.

## 2019-06-05 NOTE — Telephone Encounter (Signed)
Requested medication (s) are due for refill today: Yes  Requested medication (s) are on the active medication list: Yes  Last refill:  05/14/18  Future visit scheduled: No  Notes to clinic:  See request. Prescription has expired as well.    Requested Prescriptions  Pending Prescriptions Disp Refills   QUEtiapine (SEROQUEL) 25 MG tablet 90 tablet 4    Sig: Take 1 tablet (25 mg total) by mouth at bedtime.      Not Delegated - Psychiatry:  Antipsychotics - Second Generation (Atypical) - quetiapine Failed - 06/05/2019  8:22 AM      Failed - This refill cannot be delegated      Failed - Last BP in normal range    BP Readings from Last 1 Encounters:  03/04/19 (!) 144/84          Passed - ALT in normal range and within 180 days    ALT  Date Value Ref Range Status  03/04/2019 29 0 - 32 IU/L Final          Passed - AST in normal range and within 180 days    AST  Date Value Ref Range Status  03/04/2019 24 0 - 40 IU/L Final          Passed - Valid encounter within last 6 months    Recent Outpatient Visits           3 months ago History of migraine   [facility] [name], MD   1 year ago Polyarthralgia   [facility] [name], MD   1 year ago Annual physical exam   [facility] [name], MD   2 years ago Essential hypertension   [facility] [name], MD   3 years ago Allergic reaction to drug, initial encounter   [facility] Sherrie Mustache, Demetrios Isaacs, MD

## 2019-08-22 ENCOUNTER — Other Ambulatory Visit: Payer: Self-pay | Admitting: Family Medicine

## 2019-08-22 DIAGNOSIS — I10 Essential (primary) hypertension: Secondary | ICD-10-CM

## 2019-08-22 NOTE — Telephone Encounter (Signed)
Requested Prescriptions  Pending Prescriptions Disp Refills  . lisinopril-hydrochlorothiazide (ZESTORETIC) 20-12.5 MG tablet [Pharmacy Med Name: LISINOPRIL-HCTZ 20-12.5 MG TAB] 90 tablet 0    Sig: TAKE ONE TABLET BY MOUTH DAILY     Cardiovascular:  ACEI + Diuretic Combos Failed - 08/22/2019  8:17 AM      Failed - Last BP in normal range    BP Readings from Last 1 Encounters:  03/04/19 (!) 144/84         Passed - Na in normal range and within 180 days    Sodium  Date Value Ref Range Status  03/04/2019 141 134 - 144 mmol/L Final         Passed - K in normal range and within 180 days    Potassium  Date Value Ref Range Status  03/04/2019 4.5 3.5 - 5.2 mmol/L Final         Passed - Cr in normal range and within 180 days    Creat  Date Value Ref Range Status  11/06/2016 0.80 0.50 - 1.05 mg/dL Final    Comment:    For patients >61 years of age, the reference limit for Creatinine is approximately 13% higher for people identified as African-American. .    Creatinine, Ser  Date Value Ref Range Status  03/04/2019 0.78 0.57 - 1.00 mg/dL Final         Passed - Ca in normal range and within 180 days    Calcium  Date Value Ref Range Status  03/04/2019 9.5 8.7 - 10.2 mg/dL Final         Passed - Patient is not pregnant      Passed - Valid encounter within last 6 months    Recent Outpatient Visits          5 months ago History of migraine   [facility] [name], MD   1 year ago Polyarthralgia   [facility] [name], MD   1 year ago Annual physical exam   [facility] [name], MD   2 years ago Essential hypertension   [facility] [name], MD   3 years ago Allergic reaction to drug, initial encounter   [facility] [name], MD

## 2019-09-10 ENCOUNTER — Emergency Department: Payer: Self-pay

## 2019-09-10 ENCOUNTER — Emergency Department
Admission: EM | Admit: 2019-09-10 | Discharge: 2019-09-10 | Disposition: A | Discharge status: Home / Self Care | Payer: Self-pay | Attending: Emergency Medicine | Admitting: Emergency Medicine

## 2019-09-10 ENCOUNTER — Other Ambulatory Visit: Payer: Self-pay

## 2019-09-10 DIAGNOSIS — R7303 Prediabetes: Secondary | ICD-10-CM | POA: Insufficient documentation

## 2019-09-10 DIAGNOSIS — Z87891 Personal history of nicotine dependence: Secondary | ICD-10-CM | POA: Insufficient documentation

## 2019-09-10 DIAGNOSIS — W010XXA Fall on same level from slipping, tripping and stumbling without subsequent striking against object, initial encounter: Secondary | ICD-10-CM | POA: Insufficient documentation

## 2019-09-10 DIAGNOSIS — Y939 Activity, unspecified: Secondary | ICD-10-CM | POA: Insufficient documentation

## 2019-09-10 DIAGNOSIS — Y999 Unspecified external cause status: Secondary | ICD-10-CM | POA: Insufficient documentation

## 2019-09-10 DIAGNOSIS — Z79899 Other long term (current) drug therapy: Secondary | ICD-10-CM | POA: Insufficient documentation

## 2019-09-10 DIAGNOSIS — Y929 Unspecified place or not applicable: Secondary | ICD-10-CM | POA: Insufficient documentation

## 2019-09-10 DIAGNOSIS — S52501A Unspecified fracture of the lower end of right radius, initial encounter for closed fracture: Secondary | ICD-10-CM | POA: Insufficient documentation

## 2019-09-10 MED ORDER — OXYCODONE-ACETAMINOPHEN 7.5-325 MG PO TABS: 1.0000 | ORAL_TABLET | Freq: Four times a day (QID) | ORAL | 0 refills | Status: DC | PRN

## 2019-09-10 MED ORDER — ONDANSETRON HCL 8 MG PO TABS: 8.0000 mg | ORAL_TABLET | Freq: Three times a day (TID) | ORAL | 0 refills | Status: DC | PRN

## 2019-09-10 MED ORDER — HYDROMORPHONE HCL 1 MG/ML IJ SOLN
1.0000 mg | Freq: Once | INTRAMUSCULAR | Status: AC
  Administered 2019-09-10: 1 mg via INTRAMUSCULAR
  Filled 2019-09-10: qty 1

## 2019-09-10 NOTE — ED Triage Notes (Signed)
Pt come via POV with c/o left arm pain. Pt states she was out hiking and slipped on a nut on the ground and fell. Pt denies any LOC. Pt denies any blood thinners  Pt has obvious swelling and deformity to left wrist

## 2019-09-10 NOTE — ED Provider Notes (Signed)
[facility] Emergency Department Provider Note   ____________________________________________   First MD Initiated Contact with Patient 09/10/19 1308     (approximate)  I have reviewed the triage vital signs and the nursing notes.   HISTORY  Chief Complaint Arm Injury    HPI CHESTINA KOMATSU is a 54 y.o. female patient complaint of left wrist and forearm pain secondary to a slip and fall.  Patient denies LOC or head injury.  Patient denies taking blood thinners.  Patient denies loss of sensation or loss of function secondary to deformity.  Patient rates pain as 8/10.  Patient described the pain is "achy".  Ice pack applied in triage.         Past Medical History:  Diagnosis Date  . 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 Scoliosis 03/12/2018  . DDD (degenerative disc disease), cervical 03/12/2018  . 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 Dermatitis, eczematoid 02/04/2015  . Borderline diabetes 02/04/2015  . Avitaminosis D 02/04/2015  . BP (high blood pressure) 01/14/2015  . Insomnia 08/03/2014  . Enthesopathy 01/27/2008  . Dermatologic disease 07/08/2007  . Hypercholesterolemia without hypertriglyceridemia 07/08/2007  . History of migraine 12/02/1997  . Allergic rhinitis 03/27/1996  . History of smoking 10-25 pack years 03/27/1996    Past Surgical History:  Procedure Laterality Date  . COLONOSCOPY WITH PROPOFOL N/A 10/31/2017   Procedure: COLONOSCOPY WITH PROPOFOL;  Surgeon: Wyline Mood, MD;  Location: [facility] ENDOSCOPY;  Service: Gastroenterology;  Laterality: N/A;  . FRACTURE SURGERY  03/28/12   wrist 4 pins, 10 screws   . KNEE ARTHROSCOPY Left 08/2014   Dr. Thomasena Edis; [facility]  . VAGINAL HYSTERECTOMY  03/07/1998    Prior to Admission medications   Medication Sig Start Date End Date Taking? Authorizing Provider  levETIRAcetam (KEPPRA) 250 MG tablet TAKE  3 TABLETS BY MOUTH EVERY NIGHT AT BEDTIME 11/07/18   [name], MD  lisinopril-hydrochlorothiazide (ZESTORETIC) 20-12.5 MG tablet TAKE ONE TABLET BY MOUTH DAILY 08/22/19   [name], MD  naproxen (NAPROSYN) 500 MG tablet TAKE 1 TABLET(500 MG) BY MOUTH TWICE DAILY WITH FOOD FOR UP TO 14 DAYS AS NEEDED 10/22/17   [name], MD  oxyCODONE-acetaminophen (PERCOCET) 7.5-325 MG tablet Take 1 tablet by mouth every 6 (six) hours as needed. 09/10/19   Joni Reining, PA-C  pravastatin (PRAVACHOL) 40 MG tablet TAKE 1 TABLET BY MOUTH EVERY NIGHT AT BEDTIME 11/07/18   [name], MD  propranolol (INDERAL) 40 MG tablet TAKE 1 TABLET BY MOUTH TWICE DAILY 11/07/18   [name], MD  QUEtiapine (SEROQUEL) 25 MG tablet Take 1 tablet (25 mg total) by mouth at bedtime. 06/05/19   [name], MD    Allergies Maxalt  [rizatriptan benzoate], Cephalosporins, Codeine, Erythromycin, Ketorolac tromethamine, Penicillins, Simvastatin, Sulfa antibiotics, and Tetanus toxoid  Family History  Problem Relation Age of Onset  . Cancer Father   . Lung cancer Father   . Hypertension Father   . Mental illness Father   . Obesity Brother   . Hypertension Brother   . Colon cancer Brother   . Diabetes Brother   . Hypertension Brother   . Obesity Brother   . Stroke Brother Early 30's       paralyses of left side of body  . Heart attack Brother 32  . Leukemia Mother   . Rheum arthritis Mother   .  AAA (abdominal aortic aneurysm) Brother   . Breast cancer Neg Hx     Social History Social History   Tobacco Use  . Smoking status: Former Smoker    Quit date: 09/09/2009    Years since quitting: 10.0  . Smokeless tobacco: Never Used  . Tobacco comment: 1ppd for 25 years  Vaping Use  . Vaping Use: Never used  Substance Use Topics  . Alcohol use: No  . Drug use: No    Review of Systems Constitutional: No fever/chills Eyes: No visual changes. ENT: No sore throat. Cardiovascular:  Denies chest pain. Respiratory: Denies shortness of breath. Gastrointestinal: No abdominal pain.  No nausea, no vomiting.  No diarrhea.  No constipation. Genitourinary: Negative for dysuria. Musculoskeletal: Left wrist/forearm pain deformity. Skin: Negative for rash. Neurological: Negative for headaches, focal weakness or numbness.  Endocrine:  Hyperlipidemia Allergic/Immunilogical: See extensive allergy list.  ____________________________________________   PHYSICAL EXAM:  VITAL SIGNS: ED Triage Vitals  Enc Vitals Group     BP 09/10/19 1206 121/80     Pulse Rate 09/10/19 1206 100     Resp 09/10/19 1206 18     Temp 09/10/19 1206 98 F (36.7 C)     Temp src --      SpO2 09/10/19 1206 99 %     Weight 09/10/19 1206 165 lb (74.8 kg)     Height 09/10/19 1206 5\' 3"  (1.6 m)     Head Circumference --      Peak Flow --      Pain Score 09/10/19 1205 8     Pain Loc --      Pain Edu? --      Excl. in GC? --     Constitutional: Alert and oriented.  Moderate distress.   Eyes: Conjunctivae are normal. PERRL. EOMI. Head: Atraumatic. Neck: No stridor.  No cervical spine tenderness to palpation. Cardiovascular: Normal rate, regular rhythm. Grossly normal heart sounds.  Good peripheral circulation. Respiratory: Normal respiratory effort.  No retractions. Lungs CTAB. Gastrointestinal: Soft and nontender. No distention. No abdominal bruits. No CVA tenderness. Musculoskeletal: Obvious deformity to the left wrist.  No lower extremity tenderness nor edema.  No joint effusions. Neurologic:  Normal speech and language. No gross focal neurologic deficits are appreciated. No gait instability. Skin:  Skin is warm, dry and intact. No rash noted. Psychiatric: Mood and affect are normal. Speech and behavior are normal.  ____________________________________________   LABS (all labs ordered are listed, but only abnormal results are displayed)  Labs Reviewed - No data to  display ____________________________________________  EKG   ____________________________________________  RADIOLOGY  ED MD interpretation:    Official radiology report(s): DG Wrist Complete Left  Result Date: 09/10/2019 CLINICAL DATA:  Fall.  Pain and deformity. EXAM: LEFT WRIST - COMPLETE 3+ VIEW COMPARISON:  No prior. FINDINGS: Comminuted, displaced fracture of the distal left radius with severe angulation deformity noted. Fracture extends into the radiocarpal joint. Displaced ulnar styloid fracture. Diffuse degenerative change, most prominent about the first carpometacarpal joint. Diffuse soft tissue swelling. IMPRESSION: 1. Comminuted, displaced, angulated fracture of the distal left radius noted. Fracture extends into the radiocarpal joint space. 2.  Displaced ulnar styloid fracture. Electronically Signed   By: 02-17-1971  Register   On: 09/10/2019 13:34    ____________________________________________   PROCEDURES  Procedure(s) performed (including Critical Care):  Procedures   ____________________________________________   INITIAL IMPRESSION / ASSESSMENT AND PLAN / ED COURSE  As part of my medical decision making, I reviewed the following  data within the electronic MEDICAL RECORD NUMBER     Patient presents with left wrist deformity, pain, and swollen secondary to a comminuted displaced fracture distal left radius.  Discussed patient with Dr. Odis Luster who recommends splint and will follow up in his office tomorrow.   Jenesa Foresta Fake was evaluated in Emergency Department on 09/10/2019 for the symptoms described in the history of present illness. She was evaluated in the context of the global COVID-19 pandemic, which necessitated consideration that the patient might be at risk for infection with the SARS-CoV-2 virus that causes COVID-19. Institutional protocols and algorithms that pertain to the evaluation of patients at risk for COVID-19 are in a state of rapid change based on  information released by regulatory bodies including the CDC and federal and state organizations. These policies and algorithms were followed during the patient's care in the ED.       ____________________________________________   FINAL CLINICAL IMPRESSION(S) / ED DIAGNOSES  Final diagnoses:  Closed fracture of distal end of right radius, unspecified fracture morphology, initial encounter     ED Discharge Orders         Ordered    oxyCODONE-acetaminophen (PERCOCET) 7.5-325 MG tablet  Every 6 hours PRN        09/10/19 1403           Note:  This document was prepared using Dragon voice recognition software and may include unintentional dictation errors.    Joni Reining, PA-C 09/10/19 1411    Sharyn Creamer, MD 09/10/19 1447

## 2019-09-10 NOTE — Discharge Instructions (Addendum)
Follow discharge care instructions and wear splint and sling until evaluation by orthopedics tomorrow.  Call at 8:30 in the morning to schedule appointment.  Tell them you are follow-up in emergency room for Dr. Odis Luster.

## 2019-09-10 NOTE — ED Notes (Signed)
See triage note  presents s/p fall  States she stepped on something and fell forward  Landed on left wrist  Positive deformity  Good pulses

## 2019-10-23 ENCOUNTER — Telehealth: Payer: Self-pay

## 2019-10-23 DIAGNOSIS — R42 Dizziness and giddiness: Secondary | ICD-10-CM

## 2019-10-23 MED ORDER — MECLIZINE HCL 25 MG PO TABS: 25.0000 mg | ORAL_TABLET | Freq: Three times a day (TID) | ORAL | 0 refills | Status: DC | PRN

## 2019-10-23 NOTE — Telephone Encounter (Signed)
Patient advised and verbalized understanding 

## 2019-10-23 NOTE — Telephone Encounter (Signed)
Need more info, what exactly are her symptoms, has she had this before, is this is something we haven't seen her for then she would need to schedule visit.

## 2019-10-23 NOTE — Telephone Encounter (Signed)
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 Patient that she has seen Dr. Sharen Hint for this problem before but not pcp.

## 2019-10-23 NOTE — Telephone Encounter (Signed)
Copied from CRM [id number]. Topic: General - Other >> Oct 23, 2019  8:36 AM Jaquita Rector A wrote: Reason for CRM: Patient called to inform Dr Sherrie Mustache that she is having Virtigo symptoms asking if she can get something called in to the pharmacy please. Can be reached at Ph# [phone]

## 2019-10-23 NOTE — Telephone Encounter (Signed)
She can take meclizine if this is identical to what she had in the past. I sent prescription to [facility].

## 2019-11-18 ENCOUNTER — Other Ambulatory Visit: Payer: Self-pay | Admitting: Family Medicine

## 2019-11-18 DIAGNOSIS — I10 Essential (primary) hypertension: Secondary | ICD-10-CM

## 2019-12-20 ENCOUNTER — Other Ambulatory Visit: Payer: Self-pay | Admitting: Family Medicine

## 2019-12-20 DIAGNOSIS — I10 Essential (primary) hypertension: Secondary | ICD-10-CM

## 2019-12-21 NOTE — Telephone Encounter (Signed)
Requested medication (s) are due for refill today: yes   Requested medication (s) are on the active medication list: yes   Last refill: 11/18/2019  Future visit scheduled: no  Notes to clinic:  overdue for follow up Attempted to contact patient no answer and no vm    Requested Prescriptions  Pending Prescriptions Disp Refills   lisinopril-hydrochlorothiazide (ZESTORETIC) 20-12.5 MG tablet [Pharmacy Med Name: LISINOPRIL-HCTZ 20-12.5 MG TAB] 30 tablet 0    Sig: TAKE ONE TABLET BY MOUTH DAILY.  SCHEDULE A FOLLOW UP APPOINTMENT      Cardiovascular:  ACEI + Diuretic Combos Failed - 12/20/2019  7:54 PM      Failed - Na in normal range and within 180 days    Sodium  Date Value Ref Range Status  03/04/2019 141 134 - 144 mmol/L Final          Failed - K in normal range and within 180 days    Potassium  Date Value Ref Range Status  03/04/2019 4.5 3.5 - 5.2 mmol/L Final          Failed - Cr in normal range and within 180 days    Creat  Date Value Ref Range Status  11/06/2016 0.80 0.50 - 1.05 mg/dL Final    Comment:    For patients >69 years of age, the reference limit for Creatinine is approximately 13% higher for people identified as African-American. .    Creatinine, Ser  Date Value Ref Range Status  03/04/2019 0.78 0.57 - 1.00 mg/dL Final          Failed - Ca in normal range and within 180 days    Calcium  Date Value Ref Range Status  03/04/2019 9.5 8.7 - 10.2 mg/dL Final          Failed - Valid encounter within last 6 months    Recent Outpatient Visits           9 months ago History of migraine   [facility] [name], MD   1 year ago Polyarthralgia   [facility] [name], MD   2 years ago Annual physical exam   [facility] [name], MD   3 years ago Essential hypertension   [facility] [name], MD   4 years ago Allergic reaction to drug, initial encounter    [facility] [name], MD              Passed - Patient is not pregnant      Passed - Last BP in normal range    BP Readings from Last 1 Encounters:  09/10/19 121/80            pravastatin (PRAVACHOL) 40 MG tablet [Pharmacy Med Name: PRAVASTATIN SODIUM 40 MG TAB] 30 tablet 0    Sig: TAKE ONE TABLET BY MOUTH EVERY NIGHT AT BEDTIME      Cardiovascular:  Antilipid - Statins Failed - 12/20/2019  7:54 PM      Failed - LDL in normal range and within 360 days    LDL Cholesterol (Calc)  Date Value Ref Range Status  11/06/2016 84 mg/dL (calc) Final    Comment:    Reference range: <100 . Desirable range <100 mg/dL for primary prevention;   <70 mg/dL for patients with CHD or diabetic patients  with > or = 2 CHD risk factors. Marland Kitchen LDL-C is now calculated using the Martin-Hopkins  calculation, which is a  validated novel method providing  better accuracy than the Friedewald equation in the  estimation of LDL-C.  Horald Pollen et al. Lenox Ahr. 7169;678(93): 2061-2068  (http://education.[employer].com/faq/FAQ164)    LDL Chol Calc (NIH)  Date Value Ref Range Status  03/04/2019 96 0 - 99 mg/dL Final          Passed - Total Cholesterol in normal range and within 360 days    Cholesterol, Total  Date Value Ref Range Status  03/04/2019 162 100 - 199 mg/dL Final          Passed - HDL in normal range and within 360 days    HDL  Date Value Ref Range Status  03/04/2019 47 >39 mg/dL Final          Passed - Triglycerides in normal range and within 360 days    Triglycerides  Date Value Ref Range Status  03/04/2019 102 0 - 149 mg/dL Final          Passed - Patient is not pregnant      Passed - Valid encounter within last 12 months    Recent Outpatient Visits           9 months ago History of migraine   [facility] [name], MD   1 year ago Polyarthralgia   [facility] [name], MD   2 years ago Annual  physical exam   [facility] [name], MD   3 years ago Essential hypertension   [facility] [name], MD   4 years ago Allergic reaction to drug, initial encounter   [facility] Sherrie Mustache, Demetrios Isaacs, MD

## 2019-12-23 ENCOUNTER — Telehealth: Payer: Self-pay

## 2019-12-23 DIAGNOSIS — K529 Noninfective gastroenteritis and colitis, unspecified: Secondary | ICD-10-CM

## 2019-12-23 NOTE — Telephone Encounter (Signed)
Copied from CRM [id number]. Topic: Referral - Request for Referral >> Dec 23, 2019  9:17 AM Gwenlyn Fudge wrote: Has patient seen PCP for this complaint? Yes.   *If NO, is insurance requiring patient see PCP for this issue before PCP can refer them? Referral for which specialty: GI  Preferred provider/office: [facility]  Reason for referral: Pt states that she was diagnosed with Utriculitis a few years ago and never followed up. Please advise.    Fax# [phone]

## 2019-12-23 NOTE — Telephone Encounter (Signed)
Need more info. I don't see anything in her record from GI about utriculitis and not familiar with the condition. Colonoscopy in 2019 was normal.

## 2019-12-23 NOTE — Telephone Encounter (Signed)
Colitis is an acute infection that usual resolves with antibiotics. The follow up for this is a colonoscopy to make sure everything has healed up. Since she had a normal colonoscopy in 2019 there is no other follow up indicated unless symptoms come back.

## 2019-12-23 NOTE — Telephone Encounter (Signed)
I called and spoke with patient. She says her diagnosis was colitis. She was seen at [facility] back in 2017. Patient says that the colonoscopy from 2019 wouldn't show anything because colitis only shows when there is a flare up. I looked back in patients chart and found where she was seen at [facility] on 10/31/2015 for colitis. She was seen here in our office for an ER follow up on 11/03/2015 for Segmental colitis with rectal bleeding. It was noted during that visit that :symptoms resolved with cipro and flagyl. Counseled to drink plenty of fluids. Consider fiber supplement. No further evaluation at this point. If symptoms recur will refer for colonoscopy.   Patient says that she was supposed to have followed up with GI about this, but never did. Patient would like a referral to GI to follow up on this problem. Please advise.

## 2019-12-28 NOTE — Telephone Encounter (Signed)
OK. Have placed order for GI referral. She should get a call from [facility] in the next week to 10 days.

## 2019-12-28 NOTE — Telephone Encounter (Signed)
Patient advised as below. Patient states she has had 3 flare ups of colitis in the past year. She says her most recent flare up was about 1 month ago. She each episode she experience abdominal pain/ cramps, mouth sores and rectal bleeding. Patient says she is about over most of these symptoms now. She states she doesn't like coming to the doctors so she has been putting this off for a while. Patient says that she wants to get things checked out. Her brother died of colon cancer at the age of 75. She says he died 3 weeks after finding out he had colon cancer. Please advise if patient needs to schedule an appointment here for evaluation. She says she doesn't want to come in if she doesn't have to.

## 2019-12-28 NOTE — Telephone Encounter (Signed)
Patient advised and verbalized understanding 

## 2019-12-28 NOTE — Addendum Note (Signed)
Addended by: [name] on: 12/28/2019 03:01 PM   Modules accepted: Orders

## 2019-12-31 ENCOUNTER — Other Ambulatory Visit: Payer: Self-pay

## 2019-12-31 ENCOUNTER — Ambulatory Visit (INDEPENDENT_AMBULATORY_CARE_PROVIDER_SITE_OTHER): Payer: BLUE CROSS/BLUE SHIELD | Admitting: Physician Assistant

## 2019-12-31 DIAGNOSIS — I10 Essential (primary) hypertension: Secondary | ICD-10-CM

## 2019-12-31 DIAGNOSIS — K50119 Crohn's disease of large intestine with unspecified complications: Secondary | ICD-10-CM

## 2019-12-31 DIAGNOSIS — J301 Allergic rhinitis due to pollen: Secondary | ICD-10-CM

## 2019-12-31 MED ORDER — METRONIDAZOLE 500 MG PO TABS: 500.0000 mg | ORAL_TABLET | Freq: Three times a day (TID) | ORAL | 0 refills | Status: AC

## 2019-12-31 MED ORDER — PROPRANOLOL HCL 40 MG PO TABS: 40.0000 mg | ORAL_TABLET | Freq: Two times a day (BID) | ORAL | 0 refills | Status: DC

## 2019-12-31 MED ORDER — PREDNISONE 10 MG PO TABS: ORAL_TABLET | ORAL | 0 refills | Status: DC

## 2019-12-31 MED ORDER — CIPROFLOXACIN HCL 500 MG PO TABS: 500.0000 mg | ORAL_TABLET | Freq: Two times a day (BID) | ORAL | 0 refills | Status: AC

## 2019-12-31 NOTE — Progress Notes (Signed)
MyChart Video Visit    Virtual Visit via Video Note   This visit type was conducted due to national recommendations for restrictions regarding the COVID-19 Pandemic (e.g. social distancing) in an effort to limit this patient's exposure and mitigate transmission in our community. This patient is at least at moderate risk for complications without adequate follow up. This format is felt to be most appropriate for this patient at this time. Physical exam was limited by quality of the video and audio technology used for the visit.   Patient location: Home Provider location: Office  I discussed the limitations of evaluation and management by telemedicine and the availability of in person appointments. The patient expressed understanding and agreed to proceed.  [name]   DOB: 1965-02-17   54 y.o. Female  MRN: [id number] Visit Date: 12/31/2019  Today's healthcare provider: Trey Sailors, PA-C   Chief Complaint  Patient presents with  . Sore Throat  I,Porsha C McClurkin,acting as a scribe for Trey Sailors, PA-C.,have documented all relevant documentation on the behalf of Trey Sailors, PA-C,as directed by  Trey Sailors, PA-C while in the presence of Trey Sailors, PA-C.  Subjective    Sore Throat  This is a new problem. The current episode started in the past 7 days. The problem has been unchanged. There has been no fever. The pain is at a severity of 4/10. The pain is mild. Associated symptoms include coughing. Pertinent negatives include no congestion, headaches, shortness of breath or trouble swallowing.   Hypertension, follow-up  BP Readings from Last 3 Encounters:  09/10/19 121/80  03/04/19 (!) 144/84  03/10/18 (!) 148/91   Wt Readings from Last 3 Encounters:  09/10/19 165 lb (74.8 kg)  03/04/19 186 lb (84.4 kg)  03/10/18 179 lb (81.2 kg)     She was last seen for hypertension 10 months ago.  BP at that visit was elevated. Management since  that visit includes switching from propranolol 40 mg BID to Lisinopril-HCTZ due to insurance issues. However, she reports she has a dry hacking cough for > 3 weeks with this medication and would like to return to propranolol.   She reports good compliance with treatment. She is having side effects. Dry cough.  She is following a Regular diet. She is not exercising. She does not smoke.  Use of agents associated with hypertension: none.   Outside blood pressures are not checked. Symptoms: No chest pain No chest pressure  No palpitations No syncope  No dyspnea No orthopnea  No paroxysmal nocturnal dyspnea No lower extremity edema   Pertinent labs: Lab Results  Component Value Date   CHOL 162 03/04/2019   HDL 47 03/04/2019   LDLCALC 96 03/04/2019   TRIG 102 03/04/2019   CHOLHDL 3.4 03/04/2019   Lab Results  Component Value Date   NA 141 03/04/2019   K 4.5 03/04/2019   CREATININE 0.78 03/04/2019   GFRNONAA 86 03/04/2019   GFRAA 100 03/04/2019   GLUCOSE 95 03/04/2019     The 10-year ASCVD risk score Denman George DC Jr., et al., 2013) is: 2.2%   --------------------------------------------------------------------------------------------------- Patient is also reports a flare of colitis. She reports having frequent, bloody bowel movements. She also reports having mouth ulcers, which is something she has not previously had before. She has had intermittent flares since 2017. In 2017, she presented to the ER with abdominal pain and blood bowel movements. CT abdomen pelvis on 10/31/2015 showed the following:  Stomach/Bowel: The stomach is normal. The small bowel loops have a normal course and caliber. The appendix is visualized and appears normal. From the splenic flexure to the mid sigmoid colon there is abnormal wall thickening and hyperemia of the Vasa recta compatible with segmental colitis. There is no pneumatosis or bowel Perforation.  She was treated with a course of cipro and  flagyl and her symptoms resolved. Reports she had follow up with GI but ultimately did not follow through. Has had several flares since then. Denies fevers, chills, nausea, vomiting today. Reports her nephew is being treated for colitis with an enema nightly and oral pill, she is not sure if this is Crohn's or ulcerative colitis. Her brother died two years ago at age 95 of advanced stage colon cancer. She subsequently got a colonoscopy in 2019 which was negative.     Medications: Outpatient Medications Prior to Visit  Medication Sig  . levETIRAcetam (KEPPRA) 250 MG tablet TAKE 3 TABLETS BY MOUTH EVERY NIGHT AT BEDTIME  . lisinopril-hydrochlorothiazide (ZESTORETIC) 20-12.5 MG tablet Take 1 tablet by mouth daily.  . meclizine (ANTIVERT) 25 MG tablet Take 1 tablet (25 mg total) by mouth 3 (three) times daily as needed for dizziness.  . naproxen (NAPROSYN) 500 MG tablet TAKE 1 TABLET(500 MG) BY MOUTH TWICE DAILY WITH FOOD FOR UP TO 14 DAYS AS NEEDED  . ondansetron (ZOFRAN) 8 MG tablet Take 1 tablet (8 mg total) by mouth every 8 (eight) hours as needed for nausea or vomiting.  Marland Kitchen oxyCODONE-acetaminophen (PERCOCET) 7.5-325 MG tablet Take 1 tablet by mouth every 6 (six) hours as needed.  . pravastatin (PRAVACHOL) 40 MG tablet Take 1 tablet (40 mg total) by mouth at bedtime.  Marland Kitchen QUEtiapine (SEROQUEL) 25 MG tablet Take 1 tablet (25 mg total) by mouth at bedtime.  . propranolol (INDERAL) 40 MG tablet TAKE 1 TABLET BY MOUTH TWICE DAILY (Patient not taking: Reported on 12/31/2019)   No facility-administered medications prior to visit.    Review of Systems  HENT: Positive for postnasal drip and sore throat. Negative for congestion, sinus pressure, sinus pain, sneezing and trouble swallowing.   Respiratory: Positive for cough. Negative for shortness of breath.   Neurological: Negative for headaches.      Objective    There were no vitals taken for this visit.   Physical Exam Constitutional:       Appearance: Normal appearance.  Pulmonary:     Effort: Pulmonary effort is normal. No respiratory distress.  Neurological:     Mental Status: She is alert.  Psychiatric:        Mood and Affect: Mood normal.        Behavior: Behavior normal.        Assessment & Plan    1. Primary hypertension  Discontinue lisinopril-HCTZ and restart propranolol.   - propranolol (INDERAL) 40 MG tablet; Take 1 tablet (40 mg total) by mouth 2 (two) times daily.  Dispense: 180 tablet; Refill: 0  2. Segmental colitis with complication [facility])  Reviewing her imaging and considering her history, concerned for inflammatory bowel disease flare. She may also have infectious colitis. Will treat with antibiotics and steroids. Counseled on return precautions if she is worsening and potential need for advanced imaging if there is no improvement. Referred to [facility] due to lack of insurance status and financial assistance with that health center. Encouraged to keep GI appointment for longer term management.   - Ambulatory referral to Gastroenterology - ciprofloxacin (CIPRO) 500 MG  tablet; Take 1 tablet (500 mg total) by mouth 2 (two) times daily for 7 days.  Dispense: 14 tablet; Refill: 0 - metroNIDAZOLE (FLAGYL) 500 MG tablet; Take 1 tablet (500 mg total) by mouth 3 (three) times daily for 7 days.  Dispense: 21 tablet; Refill: 0 - predniSONE (DELTASONE) 10 MG tablet; Take 6 pills on day 1, 5 pills on day 2 and so on until complete.  Dispense: 21 tablet; Refill: 0  3. Allergic rhinitis due to pollen, unspecified seasonality     No follow-ups on file.     I discussed the assessment and treatment plan with the patient. The patient was provided an opportunity to ask questions and all were answered. The patient agreed with the plan and demonstrated an understanding of the instructions.   The patient was advised to call back or seek an in-person evaluation if the symptoms worsen or if the condition fails to  improve as anticipated.   ITrey Sailors, PA-C, have reviewed all documentation for this visit. The documentation on 12/31/19 for the exam, diagnosis, procedures, and orders are all accurate and complete.  The entirety of the information documented in the History of Present Illness, Review of Systems and Physical Exam were personally obtained by me. Portions of this information were initially documented by [facility] and reviewed by me for thoroughness and accuracy.    Maryella Shivers [facility] [phone] (phone) [phone] (fax)  [facility]

## 2020-01-05 ENCOUNTER — Telehealth: Payer: Self-pay | Admitting: Family Medicine

## 2020-01-05 DIAGNOSIS — K50119 Crohn's disease of large intestine with unspecified complications: Secondary | ICD-10-CM

## 2020-01-05 NOTE — Telephone Encounter (Signed)
Pt called and stated she had a reaction to the ciprofloxacin (CIPRO) 500 MG tablet and has rash all over  / pt wants to know if she should continue taking the metroNIDAZOLE (FLAGYL) 500 MG tablet And if something else can be sent in place of the CIPRO/ please advise   Pt also stated she was suppose to have a referral to [facility] for Laurette Schimke (fax# [phone]) but she received a call from [place] [facility] but they dont have what she needs so she can not go there/ please advise

## 2020-01-05 NOTE — Telephone Encounter (Signed)
Please review. Thanks!  

## 2020-01-05 NOTE — Telephone Encounter (Signed)
Please review and advise. Patient last seen by Adriana on 12/31/2019.

## 2020-01-05 NOTE — Telephone Encounter (Signed)
Patient was advised. She would like a a different medcine send into pharmacy if possible because she not all the way better. I advised her referral was replaced and sedn back to the referral team. Please advise

## 2020-01-05 NOTE — Telephone Encounter (Signed)
Based on her allergies, I don't have anything else to give her besides moxifloxacin, which is in the same category as cipro. She cannot have penicillins or sulfa and these are the other two antibiotic regimens. I can try moxifloxacin in case the allergy is flagyl related. It is the only other medication left.

## 2020-01-05 NOTE — Telephone Encounter (Signed)
It's because her PCP placed a referral on 12/28/2019 and they closed out my referral as extra. I will be unable to tell if cipro or flagyl is causing the rash. She has had almost 7 days of the medicine. Is she feeling better? It may just be OK to stop the antibiotics if she's feeling better. If not, she'll need to change it as flagyl alone won't be helpful.

## 2020-01-07 ENCOUNTER — Ambulatory Visit: Payer: Self-pay | Admitting: *Deleted

## 2020-01-07 NOTE — Telephone Encounter (Signed)
Pt returned call.  I read her the message from Osvaldo Angst regarding the antibiotic allergy.  I let her know about the moxifloxacin can be tried as it's the only other medication to try for her issue. Pt said she is feeling better however she is still bleeding.  "I have colitis".   She stopped taking the antibiotic day before yesterday so today is the first full day without it.   "It usually takes 4-5 days to get out of my system".    I have a rash that is itchy and red.   Antibiotics do this to me.   She is taking Benadryl which isn't really helping.  She wants to wait and see how she does before trying another antibiotic.   "I will call back if I'm not getting better or this rash doesn't go away".  "Let's just wait and see".

## 2020-01-07 NOTE — Telephone Encounter (Signed)
Just FYI. Thanks!  

## 2020-01-07 NOTE — Telephone Encounter (Signed)
Called patient and no answer or VM setup to leave a message. If patient calls back okay for PEC to advise patient.

## 2020-01-08 NOTE — Telephone Encounter (Signed)
Patient was advised and states she will just continue with the same treatment. Just a [place]

## 2020-02-01 ENCOUNTER — Other Ambulatory Visit: Payer: Self-pay

## 2020-02-01 ENCOUNTER — Ambulatory Visit (INDEPENDENT_AMBULATORY_CARE_PROVIDER_SITE_OTHER): Payer: 59 | Admitting: Family Medicine

## 2020-02-01 ENCOUNTER — Encounter: Payer: Self-pay | Admitting: Family Medicine

## 2020-02-01 VITALS — BP 140/81 | HR 50 | Temp 98.2°F | Wt 180.0 lb

## 2020-02-01 DIAGNOSIS — B370 Candidal stomatitis: Secondary | ICD-10-CM | POA: Diagnosis not present

## 2020-02-01 MED ORDER — CLOTRIMAZOLE 10 MG MT TROC: 10.0000 mg | Freq: Every day | OROMUCOSAL | 0 refills | Status: DC

## 2020-02-01 NOTE — Progress Notes (Signed)
Acute Office Visit  Subjective:    Patient ID: Angela Woodward, female    DOB: [birth year], 55 y.o.   MRN: [id number]  No chief complaint on file.   HPI Patient is in today for evaluation of rash.  Patient states that since mid-November she has had a rash/sores in her mouth that she thinks is from her colitis.  She states this because she said her grandson has trouble with his colon and he has a rash/sores in his mouth also.  She also relates that her son broke out in shingles 3-4 weeks ago.  She also wants to discuss that her BP has been running high.  She had been told to stop her Lisinopril/HCT about 3-4 weeks ago, due to side effects.  However, she states the blood pressure issue was present before then.   Past Medical History:  Diagnosis Date  . 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 COLONOSCOPY WITH PROPOFOL N/A 10/31/2017   Procedure: COLONOSCOPY WITH PROPOFOL;  Surgeon: Wyline Mood, MD;  Location: [facility] ENDOSCOPY;  Service: Gastroenterology;  Laterality: N/A;  . FRACTURE SURGERY  03/28/12   wrist 4 pins, 10 screws   . KNEE ARTHROSCOPY Left 08/2014   Dr. Thomasena Edis; [facility]  . VAGINAL HYSTERECTOMY  03/07/1998    Family History  Problem Relation Age of Onset  . Cancer Father   . Lung cancer Father   . Hypertension Father   . Mental illness Father   . Obesity Brother   . Hypertension Brother   . Colon cancer Brother   . Diabetes Brother   . Hypertension Brother   . Obesity Brother   . Stroke Brother Early 30's       paralyses of left side of body  . Heart attack Brother 32  . Leukemia Mother   . Rheum arthritis Mother   . AAA (abdominal aortic aneurysm) Brother   . Breast cancer Neg Hx     Social History   Socioeconomic History  . Marital status: Married    Spouse name: Not on file  . Number of children: 2  . Years of education: Not on file  . Highest education level: Not on  file  Occupational History  . Occupation: Full Time  Tobacco Use  . Smoking status: Former Smoker    Quit date: 09/09/2009    Years since quitting: 10.4  . Smokeless tobacco: Never Used  . Tobacco comment: 1ppd for 25 years  Vaping Use  . Vaping Use: Never used  Substance and Sexual Activity  . Alcohol use: No  . Drug use: No  . Sexual activity: Not on file  Other Topics Concern  . Not on file  Social History Narrative  . Not on file   Social Determinants of Health   Financial Resource Strain: Not on file  Food Insecurity: Not on file  Transportation Needs: Not on file  Physical Activity: Not on file  Stress: Not on file  Social Connections: Not on file  Intimate Partner Violence: Not on file    Outpatient Medications Prior to Visit  Medication Sig Dispense Refill  . meclizine (ANTIVERT) 25 MG tablet Take 1 tablet (25 mg total) by mouth 3 (three) times daily as needed for dizziness. 30 tablet 0  . naproxen (NAPROSYN) 500 MG tablet TAKE 1 TABLET(500 MG) BY MOUTH TWICE DAILY WITH FOOD FOR UP TO 14 DAYS AS NEEDED 30 tablet 5  .  pravastatin (PRAVACHOL) 40 MG tablet Take 1 tablet (40 mg total) by mouth at bedtime. 90 tablet 0  . propranolol (INDERAL) 40 MG tablet Take 1 tablet (40 mg total) by mouth 2 (two) times daily. 180 tablet 0  . QUEtiapine (SEROQUEL) 25 MG tablet Take 1 tablet (25 mg total) by mouth at bedtime. 90 tablet 4  . levETIRAcetam (KEPPRA) 250 MG tablet TAKE 3 TABLETS BY MOUTH EVERY NIGHT AT BEDTIME 90 tablet 4  . lisinopril-hydrochlorothiazide (ZESTORETIC) 20-12.5 MG tablet Take 1 tablet by mouth daily. 90 tablet 0  . ondansetron (ZOFRAN) 8 MG tablet Take 1 tablet (8 mg total) by mouth every 8 (eight) hours as needed for nausea or vomiting. 20 tablet 0  . oxyCODONE-acetaminophen (PERCOCET) 7.5-325 MG tablet Take 1 tablet by mouth every 6 (six) hours as needed. 20 tablet 0  . predniSONE (DELTASONE) 10 MG tablet Take 6 pills on day 1, 5 pills on day 2 and so on  until complete. 21 tablet 0   No facility-administered medications prior to visit.    Allergies  Allergen Reactions  . Maxalt  [Rizatriptan Benzoate] Swelling    Throat swelling.  . Cephalosporins   . Codeine   . Erythromycin   . Ketorolac Tromethamine   . Penicillins   . Simvastatin Swelling    Other reaction(s): Muscle Pain  . Sulfa Antibiotics   . Tetanus Toxoid     Other reaction(s): Swelling    Review of Systems  Constitutional: Negative for fatigue and fever.  Eyes: Negative for visual disturbance.  Skin: Positive for rash (in mouth and no where else).  Neurological: Negative for dizziness and headaches.       Objective:    Physical Exam Constitutional:      General: She is not in acute distress.    Appearance: She is well-developed and well-nourished.  HENT:     Head: Normocephalic and atraumatic.     Right Ear: Hearing normal.     Left Ear: Hearing normal.     Nose: Nose normal.     Mouth/Throat:     Comments: White patches on buccal mucosa bilaterally. Eyes:     General: Lids are normal. No scleral icterus.       Right eye: No discharge.        Left eye: No discharge.     Conjunctiva/sclera: Conjunctivae normal.  Pulmonary:     Effort: Pulmonary effort is normal. No respiratory distress.  Musculoskeletal:        General: Normal range of motion.  Skin:    General: Skin is intact.     Findings: No lesion or rash.  Neurological:     Mental Status: She is alert and oriented to person, place, and time.  Psychiatric:        Mood and Affect: Mood and affect normal.        Speech: Speech normal.        Behavior: Behavior normal.        Thought Content: Thought content normal.     BP 140/81 (BP Location: Right Arm, Patient Position: Sitting, Cuff Size: Normal)   Pulse (!) 50   Temp 98.2 F (36.8 C) (Oral)   Wt 180 lb (81.6 kg)   SpO2 100%   BMI 31.89 kg/m   BP Readings from Last 3 Encounters:  02/01/20 140/81  09/10/19 121/80  03/04/19 (!)  144/84    Wt Readings from Last 3 Encounters:  02/01/20 180 lb (81.6 kg)  09/10/19 165  lb (74.8 kg)  03/04/19 186 lb (84.4 kg)    Health Maintenance Due  Topic Date Due  . Hepatitis C Screening  Never done  . COVID-19 Vaccine (1) Never done  . HIV Screening  Never done  . PAP SMEAR-Modifier  02/16/2012  . INFLUENZA VACCINE  08/23/2019  . MAMMOGRAM  11/12/2019    There are no preventive care reminders to display for this patient.   Lab Results  Component Value Date   TSH 1.720 03/04/2019   Lab Results  Component Value Date   WBC 7.1 10/10/2017   HGB 13.3 10/10/2017   HCT 40.1 10/10/2017   MCV 90 10/10/2017   PLT 303 10/10/2017   Lab Results  Component Value Date   NA 141 03/04/2019   K 4.5 03/04/2019   CO2 23 03/04/2019   GLUCOSE 95 03/04/2019   BUN 14 03/04/2019   CREATININE 0.78 03/04/2019   BILITOT 0.5 03/04/2019   ALKPHOS 111 03/04/2019   AST 24 03/04/2019   ALT 29 03/04/2019   PROT 6.5 03/04/2019   ALBUMIN 4.3 03/04/2019   CALCIUM 9.5 03/04/2019   ANIONGAP 8 06/24/2016   Lab Results  Component Value Date   CHOL 162 03/04/2019   Lab Results  Component Value Date   HDL 47 03/04/2019   Lab Results  Component Value Date   LDLCALC 96 03/04/2019   Lab Results  Component Value Date   TRIG 102 03/04/2019   Lab Results  Component Value Date   CHOLHDL 3.4 03/04/2019   Lab Results  Component Value Date   HGBA1C 5.7 (H) 03/04/2019       Assessment & Plan:   1. Oral candidiasis Onset over the past 2 months. History of colitis diagnosed in 2017 with some intermittent flares. Scheduled for GI referral on 02-19-20 at [facility]. Will treat oral candidiasis with Mycelex Troche and recheck prn to be sure this is not lichen planus. - clotrimazole (MYCELEX) 10 MG troche; Take 1 tablet (10 mg total) by mouth 5 (five) times daily.  Dispense: 28 Troche; Refill: 0   No orders of the defined types were placed in this encounter.  I, Maggie Senseney, PA-C, have  reviewed all documentation for this visit. The documentation on 02/01/20 for the exam, diagnosis, procedures, and orders are all accurate and complete.   Adline Peals, CMA

## 2020-02-01 NOTE — Patient Instructions (Signed)
Oral Thrush, Adult Oral thrush, also called oral candidiasis, is a fungal infection that develops in the mouth and throat and on the tongue. It causes white patches to form in the mouth and on the tongue. Many cases of thrush are mild, but this infection can also be serious. Thrush can be a repeated (recurrent) problem for certain people who have a weak body defense system (immune system). The weakness can be caused by chronic illnesses, or by taking medicines that limit the body's ability to fight infection. If a person has difficulty fighting infection, the fungus that causes thrush can spread through the body. This can cause life-threatening blood or organ infections. What are the causes? This condition is caused by a fungus (yeast) called Candida albicans.  This fungus is normally present in small amounts in the mouth and on other mucous membranes. It usually causes no harm.  If conditions are present that allow the fungus to grow without control, it invades surrounding tissues and becomes an infection.  Other Candida species can also lead to thrush, though this is rare. What increases the risk? The following factors may make you more likely to develop this condition:  Having a weakened immune system.  Being an older adult.  Having diabetes, cancer, or HIV (human immunodeficiency virus).  Having dry mouth (xerostomia).  Being pregnant or breastfeeding.  Having poor dental care, especially in those who have dentures.  Using antibiotic or steroid medicines. What are the signs or symptoms? Symptoms of this condition can vary from mild and moderate to severe and persistent. Symptoms may include:  A burning feeling in the mouth and throat. This can occur at the start of a thrush infection.  White patches that stick to the mouth and tongue. The tissue around the patches may be red, raw, and painful. If rubbed (during tooth brushing, for example), the patches and the tissue of the mouth  may bleed easily.  A bad taste in the mouth or difficulty tasting foods.  A cottony feeling in the mouth.  Pain during eating and swallowing.  Poor appetite.  Cracking at the corners of the mouth.   How is this diagnosed? This condition is diagnosed based on:  A physical exam.  Your medical history. How is this treated? This condition is treated with medicines called antifungals, which prevent the growth of fungi. These medicines are either applied directly to the affected area (topical) or swallowed (oral). The treatment will depend on the severity of the condition.  Mild cases of thrush may be treated with an antifungal mouth rinse or lozenges. Treatment usually lasts about 14 days.  Moderate to severe cases of thrush can be treated with oral antifungal medicine, if they have spread to the esophagus. A topical antifungal medicine may also be used. For some severe infections, treatment may need to continue for more than 14 days. ? Oral antifungal medicines are rarely used during pregnancy because they may be harmful to the unborn child. If you are pregnant, talk with your health care provider about options for treatment.  Persistent or recurrent thrush. For cases of thrush that do not go away or keep coming back: ? Treatment may be needed twice as long as the symptoms last. ? Treatment will include both oral and topical antifungal medicines. ? People with a weakened immune system can take an antifungal medicine on a continuous basis to prevent thrush infections. It is important to treat conditions that make a person more likely to get thrush, such as   diabetes or HIV. Follow these instructions at home: Medicines  Take or use over-the-counter and prescription medicines only as told by your health care provider.  Talk with your health care provider about an over-the-counter medicine called gentian violet, which kills bacteria and fungi. Relieving soreness and discomfort To help  reduce the discomfort of thrush:  Drink cold liquids such as water or iced tea.  Try flavored ice treats or frozen juices.  Eat foods that are easy to swallow, such as gelatin, ice cream, or custard.  Try drinking from a straw if the patches in your mouth are painful.   General instructions  Eat plain, unflavored yogurt as directed by your health care provider. Check the label to make sure the yogurt contains live cultures. This yogurt can help healthy bacteria grow in the mouth and can stop the growth of the fungus that causes thrush.  If you wear dentures, remove the dentures before going to bed, brush them vigorously, and soak them in a cleaning solution as directed by your health care provider.  Rinse your mouth with a warm salt-water mixture several times a day. To make a salt-water mixture, dissolve -1 tsp (3-6 g) of salt in 1 cup (237 mL) of warm water. Contact a health care provider if:  Your symptoms are getting worse or are not improving within 7 days of starting treatment.  You have symptoms of a spreading infection, such as white patches on the skin outside of the mouth.  You are breastfeeding your baby and you have redness and pain in the nipples. Summary  Oral thrush, also called oral candidiasis, is a fungal infection that develops in the mouth and throat and on the tongue. It causes white patches to form in the mouth and on the tongue.  You are more likely to get this condition if you have a weakened immune system or an underlying condition, such as HIV, cancer, or diabetes.  This condition is treated with medicines called antifungals, which prevent the growth of fungi.  Contact a health care provider if your symptoms do not improve, or get worse, within 7 days of starting treatment. This information is not intended to replace advice given to you by your health care provider. Make sure you discuss any questions you have with your health care provider. Document  Revised: 11/14/2018 Document Reviewed: 11/14/2018 Elsevier Patient Education  2021 Elsevier Inc.  

## 2020-02-28 ENCOUNTER — Other Ambulatory Visit: Payer: Self-pay | Admitting: Family Medicine

## 2020-03-25 ENCOUNTER — Ambulatory Visit (INDEPENDENT_AMBULATORY_CARE_PROVIDER_SITE_OTHER): Payer: 59 | Admitting: Family Medicine

## 2020-03-25 ENCOUNTER — Encounter: Payer: Self-pay | Admitting: Family Medicine

## 2020-03-25 ENCOUNTER — Other Ambulatory Visit: Payer: Self-pay

## 2020-03-25 VITALS — BP 130/89 | HR 58 | Temp 97.2°F | Resp 16 | Wt 178.0 lb

## 2020-03-25 DIAGNOSIS — R9439 Abnormal result of other cardiovascular function study: Secondary | ICD-10-CM

## 2020-03-25 DIAGNOSIS — R7303 Prediabetes: Secondary | ICD-10-CM | POA: Diagnosis not present

## 2020-03-25 DIAGNOSIS — E7800 Pure hypercholesterolemia, unspecified: Secondary | ICD-10-CM | POA: Diagnosis not present

## 2020-03-25 DIAGNOSIS — E559 Vitamin D deficiency, unspecified: Secondary | ICD-10-CM | POA: Diagnosis not present

## 2020-03-25 DIAGNOSIS — Z1159 Encounter for screening for other viral diseases: Secondary | ICD-10-CM

## 2020-03-25 DIAGNOSIS — Z87891 Personal history of nicotine dependence: Secondary | ICD-10-CM

## 2020-03-25 DIAGNOSIS — I10 Essential (primary) hypertension: Secondary | ICD-10-CM | POA: Diagnosis not present

## 2020-03-25 DIAGNOSIS — J029 Acute pharyngitis, unspecified: Secondary | ICD-10-CM

## 2020-03-25 NOTE — Progress Notes (Signed)
Established patient visit   [name]   DOB: 1965-10-13   55 y.o. Female  MRN: [id number] Visit Date: 03/25/2020  Today's healthcare provider: Mila Merry, MD   Chief Complaint  Patient presents with  . Hypertension  . Hyperlipidemia  . Prediabetes   Subjective    HPI  Follow up for oral candidiasis:  The patient was last seen for this 02/01/2020 (seen by [employer], PA-C.   Changes made at last visit include treating with Mycelex Troche and advising patient recheck prn to be sure this is not lichen planus.  She reports good compliance with treatment. She feels that condition is Unchanged. She is not having side effects.  It mainly bothers her when she eats spicy foods. She does have history of IBD and feels it may be related  -----------------------------------------------------------------------------------------  Hypertension, follow-up  BP Readings from Last 3 Encounters:  03/25/20 130/89  02/01/20 140/81  09/10/19 121/80   Wt Readings from Last 3 Encounters:  03/25/20 178 lb (80.7 kg)  02/01/20 180 lb (81.6 kg)  09/10/19 165 lb (74.8 kg)     She was last seen for hypertension on 12/31/2019 (seen by Osvaldo Angst, PA-C).   Management since that visit includes Discontinue lisinopril-HCTZ and restart propranolol due to chronic cough when she was on lisinopril. She had previously been on propranolol for many years for migraine prophylaxis.   She reports good compliance with treatment. She is not having side effects.  She is following a Regular diet. She is exercising. She does not smoke.  Use of agents associated with hypertension: none.   Outside blood pressures are average 143/79 left arm, 130/80 right arm. Symptoms: No chest pain No chest pressure  No palpitations No syncope  No dyspnea No orthopnea  No paroxysmal nocturnal dyspnea No lower extremity edema   Pertinent labs: Lab Results  Component Value Date   CHOL 162  03/04/2019   HDL 47 03/04/2019   LDLCALC 96 03/04/2019   TRIG 102 03/04/2019   CHOLHDL 3.4 03/04/2019   Lab Results  Component Value Date   NA 141 03/04/2019   K 4.5 03/04/2019   CREATININE 0.78 03/04/2019   GFRNONAA 86 03/04/2019   GFRAA 100 03/04/2019   GLUCOSE 95 03/04/2019     The 10-year ASCVD risk score Denman George DC Jr., et al., 2013) is: 6.2%   ---------------------------------------------------------------------------------------------------  Follow up for Vitamin D deficiency:  The patient was last seen for this 1 year ago. Changes made at last visit include none. Lab Results  Component Value Date   VD25OH 52.5 03/04/2019    She reports poor compliance with treatment.  -----------------------------------------------------------------------------------------  Prediabetes, Follow-up  Lab Results  Component Value Date   HGBA1C 5.7 (H) 03/04/2019   HGBA1C 5.7 (H) 10/10/2017   HGBA1C 5.5 02/08/2015   GLUCOSE 95 03/04/2019   GLUCOSE 89 10/10/2017   GLUCOSE 98 11/06/2016    Last seen for for this1 year ago.  Management since that visit includes advising patient to avoid sweets and starch foods. Current symptoms include none and have been stable.  Prior visit with dietician: no Current diet: well balanced Current exercise: cardiovascular workout on exercise equipment   -----------------------------------------------------------------------------------------  Lipid/Cholesterol, Follow-up  Last lipid panel Other pertinent labs  Lab Results  Component Value Date   CHOL 162 03/04/2019   HDL 47 03/04/2019   LDLCALC 96 03/04/2019   TRIG 102 03/04/2019   CHOLHDL 3.4 03/04/2019   Lab Results  Component  Value Date   ALT 29 03/04/2019   AST 24 03/04/2019   PLT 303 10/10/2017   TSH 1.720 03/04/2019     She was last seen for this 1 year ago.  Management since that visit includes continuing same medications.  She reports good compliance with treatment. She  is not having side effects.  Current diet: well balanced Current exercise: cardiovascular workout on exercise equipment   --------------------------------------------------------------------------------------------------- She also brings a copy of [facility] screening done last month which indicated mild right carotid artery disease and was otherwise normal. Since then she started taking OTC ECASA 81mg  daily.      Medications: Outpatient Medications Prior to Visit  Medication Sig  . aspirin EC 81 MG tablet Take 81 mg by mouth daily. Swallow whole.  . meclizine (ANTIVERT) 25 MG tablet Take 1 tablet (25 mg total) by mouth 3 (three) times daily as needed for dizziness.  . naproxen (NAPROSYN) 500 MG tablet TAKE 1 TABLET(500 MG) BY MOUTH TWICE DAILY WITH FOOD FOR UP TO 14 DAYS AS NEEDED  . ondansetron (ZOFRAN) 8 MG tablet Take 1 tablet (8 mg total) by mouth every 8 (eight) hours as needed for nausea or vomiting.  . pravastatin (PRAVACHOL) 40 MG tablet TAKE ONE TABLET BY MOUTH EVERY NIGHT AT BEDTIME  . propranolol (INDERAL) 40 MG tablet Take 1 tablet (40 mg total) by mouth 2 (two) times daily.  . QUEtiapine (SEROQUEL) 25 MG tablet Take 1 tablet (25 mg total) by mouth at bedtime.  . [DISCONTINUED] clotrimazole (MYCELEX) 10 MG troche Take 1 tablet (10 mg total) by mouth 5 (five) times daily. (Patient not taking: Reported on 03/25/2020)  . [DISCONTINUED] levETIRAcetam (KEPPRA) 250 MG tablet TAKE 3 TABLETS BY MOUTH EVERY NIGHT AT BEDTIME (Patient not taking: Reported on 03/25/2020)  . [DISCONTINUED] lisinopril-hydrochlorothiazide (ZESTORETIC) 20-12.5 MG tablet Take 1 tablet by mouth daily. (Patient not taking: Reported on 03/25/2020)  . [DISCONTINUED] oxyCODONE-acetaminophen (PERCOCET) 7.5-325 MG tablet Take 1 tablet by mouth every 6 (six) hours as needed. (Patient not taking: Reported on 03/25/2020)   No facility-administered medications prior to visit.    Review of Systems  Constitutional: Negative for  appetite change, chills, fatigue and fever.  Respiratory: Negative for chest tightness and shortness of breath.   Cardiovascular: Negative for chest pain and palpitations.  Gastrointestinal: Negative for abdominal pain, nausea and vomiting.  Neurological: Negative for dizziness and weakness.       Objective    BP 130/89 (BP Location: Right Arm, Patient Position: Sitting, Cuff Size: Large)   Pulse (!) 58   Temp (!) 97.2 F (36.2 C) (Temporal)   Resp 16   Wt 178 lb (80.7 kg)   BMI 31.53 kg/m     Physical Exam   General Appearance:    Mildly obese female, alert, cooperative, in no acute distress  HENT:   Mild erythema posterior pharynx  Eyes:    PERRL, conjunctiva/corneas clear, EOM's intact       Lungs:     Clear to auscultation bilaterally, respirations unlabored  Heart:    Bradycardic. Normal rhythm. No murmurs, rubs, or gallops.   Neurologic:   Awake, alert, oriented x 3. No apparent focal neurological           defect.         Assessment & Plan     1. Primary hypertension She is now back on propranolol since lisinopril-hctz caused a cough. Her home SPB are frequently over 140. Consider adding low dose hctz if  electrolytes are normal  2. Borderline diabetes  - Hemoglobin A1c  3. Hypercholesterolemia without hypertriglyceridemia She is tolerating pravastatin well with no adverse effects.   - CBC - Comprehensive metabolic panel - Lipid panel - TSH  4. Avitaminosis D  - VITAMIN D 25 Hydroxy (Vit-D Deficiency, Fractures)  5. Need for hepatitis C screening test  - Hepatitis C antibody  6. Pharyngitis, unspecified etiology She was treated for thrush which did not help at all. Check  - Vitamin B12 - Vitamin B1  May be related to underlying IBD.   7. Abnormal carotid duplex scan (on [facility] Screening)   - US Carotid Duplex Bilateral; Future       The entirety of the information documented in the History of Present Illness, Review of Systems and Physical  Exam were personally obtained by me. Portions of this information were initially documented by the CMA and reviewed by me for thoroughness and accuracy.      Mila Merry, MD  [facility] [phone] (phone) [phone] (fax)  [facility]

## 2020-03-25 NOTE — Patient Instructions (Addendum)
Please call the [facility] ([phone]) to schedule a routine screening mammogram.  

## 2020-03-28 ENCOUNTER — Other Ambulatory Visit: Payer: Self-pay | Admitting: Family Medicine

## 2020-03-28 DIAGNOSIS — Z1231 Encounter for screening mammogram for malignant neoplasm of breast: Secondary | ICD-10-CM

## 2020-03-31 ENCOUNTER — Other Ambulatory Visit: Payer: Self-pay | Admitting: Family Medicine

## 2020-03-31 MED ORDER — HYDROCHLOROTHIAZIDE 12.5 MG PO TABS: 12.5000 mg | ORAL_TABLET | Freq: Every day | ORAL | 3 refills | Status: DC

## 2020-04-04 ENCOUNTER — Telehealth: Payer: Self-pay | Admitting: Family Medicine

## 2020-04-04 LAB — LIPID PANEL
Chol/HDL Ratio: 3.3 ratio (ref 0.0–4.4)
Cholesterol, Total: 163 mg/dL (ref 100–199)
HDL: 49 mg/dL (ref >39)
LDL Chol Calc (NIH): 93 mg/dL (ref 0–99)
Triglycerides: 120 mg/dL (ref 0–149)
VLDL Cholesterol Cal: 21 mg/dL (ref 5–40)

## 2020-04-04 LAB — COMPREHENSIVE METABOLIC PANEL
ALT: 20 IU/L (ref 0–32)
AST: 21 IU/L (ref 0–40)
Albumin/Globulin Ratio: 1.9 (ref 1.2–2.2)
Albumin: 4.6 g/dL (ref 3.8–4.9)
Alkaline Phosphatase: 114 IU/L (ref 44–121)
BUN/Creatinine Ratio: 18 (ref 9–23)
BUN: 14 mg/dL (ref 6–24)
Bilirubin Total: 0.6 mg/dL (ref 0.0–1.2)
CO2: 23 mmol/L (ref 20–29)
Calcium: 9.9 mg/dL (ref 8.7–10.2)
Chloride: 103 mmol/L (ref 96–106)
Creatinine, Ser: 0.78 mg/dL (ref 0.57–1.00)
Globulin, Total: 2.4 g/dL (ref 1.5–4.5)
Glucose: 97 mg/dL (ref 65–99)
Potassium: 4.7 mmol/L (ref 3.5–5.2)
Sodium: 141 mmol/L (ref 134–144)
Total Protein: 7 g/dL (ref 6.0–8.5)
eGFR: 90 mL/min/{1.73_m2} (ref >59)

## 2020-04-04 LAB — VITAMIN B12: Vitamin B-12: 508 pg/mL (ref 232–1245)

## 2020-04-04 LAB — CBC
Hematocrit: 44.5 % (ref 34.0–46.6)
Hemoglobin: 15 g/dL (ref 11.1–15.9)
MCH: 29.3 pg (ref 26.6–33.0)
MCHC: 33.7 g/dL (ref 31.5–35.7)
MCV: 87 fL (ref 79–97)
Platelets: 310 10*3/uL (ref 150–450)
RBC: 5.12 x10E6/uL (ref 3.77–5.28)
RDW: 12.7 % (ref 11.7–15.4)
WBC: 5.3 10*3/uL (ref 3.4–10.8)

## 2020-04-04 LAB — TSH: TSH: 1.31 u[IU]/mL (ref 0.450–4.500)

## 2020-04-04 LAB — HEMOGLOBIN A1C
Est. average glucose Bld gHb Est-mCnc: 120 mg/dL
Hgb A1c MFr Bld: 5.8 % — ABNORMAL HIGH (ref 4.8–5.6)

## 2020-04-04 LAB — VITAMIN D 25 HYDROXY (VIT D DEFICIENCY, FRACTURES): Vit D, 25-Hydroxy: 49.4 ng/mL (ref 30.0–100.0)

## 2020-04-04 LAB — VITAMIN B1: Thiamine: 175.7 nmol/L (ref 66.5–200.0)

## 2020-04-04 LAB — HEPATITIS C ANTIBODY: Hep C Virus Ab: <0.1 s/co ratio (ref 0.0–0.9)

## 2020-04-04 NOTE — Telephone Encounter (Signed)
Pt is calling to receive her lab results. Pt declined wanting assistance to get into her MyChart account. And is requesting a nurse to call her back. [phone]

## 2020-04-04 NOTE — Telephone Encounter (Signed)
Patient advised of results.

## 2020-04-13 ENCOUNTER — Other Ambulatory Visit: Payer: Self-pay

## 2020-04-13 ENCOUNTER — Ambulatory Visit
Admission: RE | Admit: 2020-04-13 | Discharge: 2020-04-13 | Disposition: A | Discharge status: Home / Self Care | Payer: 59 | Source: Ambulatory Visit | Attending: Family Medicine | Admitting: Family Medicine

## 2020-04-13 DIAGNOSIS — R9439 Abnormal result of other cardiovascular function study: Secondary | ICD-10-CM | POA: Diagnosis not present

## 2020-04-15 ENCOUNTER — Other Ambulatory Visit: Payer: Self-pay

## 2020-04-15 ENCOUNTER — Ambulatory Visit
Admission: RE | Admit: 2020-04-15 | Discharge: 2020-04-15 | Disposition: A | Discharge status: Home / Self Care | Payer: 59 | Source: Ambulatory Visit | Attending: Family Medicine | Admitting: Family Medicine

## 2020-04-15 DIAGNOSIS — Z1231 Encounter for screening mammogram for malignant neoplasm of breast: Secondary | ICD-10-CM | POA: Diagnosis not present

## 2020-04-28 ENCOUNTER — Telehealth: Payer: Self-pay | Admitting: Family Medicine

## 2020-04-28 DIAGNOSIS — I10 Essential (primary) hypertension: Secondary | ICD-10-CM

## 2020-04-28 NOTE — Telephone Encounter (Signed)
[facility] faxed refill request for the following medications:  propranolol (INDERAL) 40 MG tablet  Last Rx: 12/31/19 LOV: 03/25/20 Please advise. Thanks TNP

## 2020-04-29 MED ORDER — PROPRANOLOL HCL 40 MG PO TABS: 40.0000 mg | ORAL_TABLET | Freq: Two times a day (BID) | ORAL | 0 refills | Status: DC

## 2020-04-29 NOTE — Telephone Encounter (Signed)
Rx refill sent to [facility] pharmacy.

## 2020-05-02 ENCOUNTER — Telehealth: Payer: Self-pay

## 2020-05-02 NOTE — Telephone Encounter (Signed)
Copied from CRM [id number]. Topic: General - Other >> May 02, 2020  3:58 PM Mcneil, Ja-Kwan wrote: Reason for CRM: Pt stated she has not heard back from anyone regarding her most recent lab results. Pt requests call back. Cb# [phone]

## 2020-05-03 NOTE — Telephone Encounter (Signed)
Mammogram was normal.   

## 2020-05-03 NOTE — Telephone Encounter (Signed)
Patient advised as below.  

## 2020-05-03 NOTE — Telephone Encounter (Signed)
I remember advising patient of labs from 03/25/2020 several weeks ago, and there is documentation in chart that patient has been advised. I called and spoke with patient. She was referring to the ultrasound and mammogram results that she was still waiting on. I advised patient of the message sent to her via Mycart for the ultrasound report. Patient verbalized understanding. Patient states she cant get into her mychart account so she cannot view any messages that are sent to her. She has the information to contact mychart IT help but has not done so yet.   Please review mammogram report. She is waiting on that result.

## 2020-06-18 ENCOUNTER — Other Ambulatory Visit: Payer: Self-pay | Admitting: Family Medicine

## 2020-06-18 NOTE — Telephone Encounter (Signed)
Requested Prescriptions  Pending Prescriptions Disp Refills  . pravastatin (PRAVACHOL) 40 MG tablet [Pharmacy Med Name: PRAVASTATIN SODIUM 40 MG TAB] 90 tablet 3    Sig: TAKE ONE TABLET BY MOUTH EVERY NIGHT AT BEDTIME     Cardiovascular:  Antilipid - Statins Passed - 06/18/2020  9:03 AM      Passed - Total Cholesterol in normal range and within 360 days    Cholesterol, Total  Date Value Ref Range Status  03/25/2020 163 100 - 199 mg/dL Final         Passed - LDL in normal range and within 360 days    LDL Cholesterol (Calc)  Date Value Ref Range Status  11/06/2016 84 mg/dL (calc) Final    Comment:    Reference range: <100 . Desirable range <100 mg/dL for primary prevention;   <70 mg/dL for patients with CHD or diabetic patients  with > or = 2 CHD risk factors. Marland Kitchen LDL-C is now calculated using the Martin-Hopkins  calculation, which is a validated novel method providing  better accuracy than the Friedewald equation in the  estimation of LDL-C.  Horald Pollen et al. Lenox Ahr. 1751;025(85): 2061-2068  (http://education.[employer].com/faq/FAQ164)    LDL Chol Calc (NIH)  Date Value Ref Range Status  03/25/2020 93 0 - 99 mg/dL Final         Passed - HDL in normal range and within 360 days    HDL  Date Value Ref Range Status  03/25/2020 49 >39 mg/dL Final         Passed - Triglycerides in normal range and within 360 days    Triglycerides  Date Value Ref Range Status  03/25/2020 120 0 - 149 mg/dL Final         Passed - Patient is not pregnant      Passed - Valid encounter within last 12 months    Recent Outpatient Visits          2 months ago Primary hypertension   [facility] [name], MD   4 months ago Oral candidiasis   [employer] [facility], Jodell Cipro, PA-C   5 months ago Primary hypertension   [employer], Lavella Hammock, [place]   1 year ago History of migraine   [facility] [name], MD   2  years ago Polyarthralgia   [facility], Demetrios Isaacs, MD      Future Appointments            In 2 weeks Fisher, Demetrios Isaacs, MD [facility]

## 2020-07-05 ENCOUNTER — Ambulatory Visit: Payer: Self-pay | Admitting: Family Medicine

## 2020-08-02 ENCOUNTER — Other Ambulatory Visit: Payer: Self-pay | Admitting: Family Medicine

## 2020-08-02 DIAGNOSIS — I10 Essential (primary) hypertension: Secondary | ICD-10-CM

## 2020-08-25 ENCOUNTER — Other Ambulatory Visit: Payer: Self-pay | Admitting: Family Medicine

## 2020-08-25 NOTE — Telephone Encounter (Signed)
Please review.  Pt missed her appointment 07/05/2020.  Thanks,   -Vernona Rieger

## 2020-08-25 NOTE — Telephone Encounter (Signed)
Requested medications are due for refill today.  yes  Requested medications are on the active medications list.  yes  Last refill. 06/05/2019  Future visit scheduled.   no  Notes to clinic.  Medication not delegated. Prescription is expired.

## 2020-10-27 ENCOUNTER — Other Ambulatory Visit: Payer: Self-pay | Admitting: Family Medicine

## 2020-10-27 DIAGNOSIS — I10 Essential (primary) hypertension: Secondary | ICD-10-CM

## 2020-10-27 NOTE — Telephone Encounter (Signed)
Call to patient- follow up appointment scheduled-patient states she does not Rx before her follow up appointment Requested Prescriptions  Refused Prescriptions Disp Refills  . propranolol (INDERAL) 40 MG tablet [Pharmacy Med Name: PROPRANOLOL 40 MG TABLET] 180 tablet 0    Sig: TAKE ONE TABLET BY MOUTH TWICE A DAY     Cardiovascular:  Beta Blockers Failed - 10/27/2020  8:54 AM      Failed - Valid encounter within last 6 months    Recent Outpatient Visits          7 months ago Primary hypertension   [facility] [name], MD   8 months ago Oral candidiasis   [employer], Jodell Cipro, PA-C   10 months ago Primary hypertension   [employer], Lavella Hammock, [place]   1 year ago History of migraine   [facility] [name], MD   2 years ago Polyarthralgia   [facility] [name], MD      Future Appointments            In 4 days Fisher, Demetrios Isaacs, MD [facility]           Passed - Last BP in normal range    BP Readings from Last 1 Encounters:  03/25/20 130/89         Passed - Last Heart Rate in normal range    Pulse Readings from Last 1 Encounters:  03/25/20 (!) 58

## 2020-10-28 ENCOUNTER — Other Ambulatory Visit: Payer: Self-pay | Admitting: Family Medicine

## 2020-10-28 DIAGNOSIS — I10 Essential (primary) hypertension: Secondary | ICD-10-CM

## 2020-10-29 NOTE — Telephone Encounter (Signed)
Pt has OV with Dr Sherrie Mustache in 2 days. Pt stated she had enough med to last until appt. Refusing request

## 2020-10-31 ENCOUNTER — Other Ambulatory Visit: Payer: Self-pay

## 2020-10-31 ENCOUNTER — Ambulatory Visit (INDEPENDENT_AMBULATORY_CARE_PROVIDER_SITE_OTHER): Payer: 59 | Admitting: Family Medicine

## 2020-10-31 ENCOUNTER — Encounter: Payer: Self-pay | Admitting: Family Medicine

## 2020-10-31 VITALS — BP 125/80 | HR 54 | Temp 98.1°F | Resp 18 | Wt 176.0 lb

## 2020-10-31 DIAGNOSIS — I10 Essential (primary) hypertension: Secondary | ICD-10-CM

## 2020-10-31 DIAGNOSIS — R7303 Prediabetes: Secondary | ICD-10-CM

## 2020-10-31 DIAGNOSIS — Z23 Encounter for immunization: Secondary | ICD-10-CM

## 2020-10-31 LAB — POCT GLYCOSYLATED HEMOGLOBIN (HGB A1C)
Est. average glucose Bld gHb Est-mCnc: 114
Hemoglobin A1C: 5.6 % (ref 4.0–5.6)

## 2020-10-31 MED ORDER — PROPRANOLOL HCL 40 MG PO TABS: 40.0000 mg | ORAL_TABLET | Freq: Two times a day (BID) | ORAL | 2 refills | Status: DC

## 2020-10-31 NOTE — Progress Notes (Signed)
Established patient visit   [name]   DOB: [birth year]   55 y.o. Female  MRN: [id number] Visit Date: 10/31/2020  Today's healthcare provider: Lelon Huh, MD   Chief Complaint  Patient presents with   Hypertension   Prediabetes   Subjective    HPI  Hypertension, follow-up  BP Readings from Last 3 Encounters:  10/31/20 125/80  03/25/20 130/89  02/01/20 140/81   Wt Readings from Last 3 Encounters:  10/31/20 176 lb (79.8 kg)  03/25/20 178 lb (80.7 kg)  02/01/20 180 lb (81.6 kg)     She was last seen for hypertension 6 months ago.  BP at that visit was 130/89. Management since that visit includes adding HCTZ 12.31m daily.  She reports good compliance with treatment. She is not having side effects.  She is following a Regular diet. She is exercising. She does not smoke.  Use of agents associated with hypertension: NSAIDS.   Outside blood pressures are 118/78. Symptoms: No chest pain No chest pressure  No palpitations No syncope  No dyspnea No orthopnea  No paroxysmal nocturnal dyspnea No lower extremity edema   Pertinent labs: Lab Results  Component Value Date   CHOL 163 03/25/2020   HDL 49 03/25/2020   LDLCALC 93 03/25/2020   TRIG 120 03/25/2020   CHOLHDL 3.3 03/25/2020   Lab Results  Component Value Date   NA 141 03/25/2020   K 4.7 03/25/2020   CREATININE 0.78 03/25/2020   EGFR 90 03/25/2020   GFRNONAA 86 03/04/2019   GLUCOSE 97 03/25/2020     The 10-year ASCVD risk score (Arnett DK, et al., 2019) is: 6%   ---------------------------------------------------------------------------------------------------   Prediabetes, Follow-up  Lab Results  Component Value Date   HGBA1C 5.8 (H) 03/25/2020   HGBA1C 5.7 (H) 03/04/2019   HGBA1C 5.7 (H) 10/10/2017   GLUCOSE 97 03/25/2020   GLUCOSE 95 03/04/2019   GLUCOSE 89 10/10/2017    Last seen for for this 6 months ago.  Management since that visit includes continue lifestyle  modifications. Current symptoms include none and have been stable.  Prior visit with dietician: no Current diet: well balanced Current exercise: walking  -----------------------------------------------------------------------------------------     Medications: Outpatient Medications Prior to Visit  Medication Sig   aspirin EC 81 MG tablet Take 81 mg by mouth daily. Swallow whole.   hydrochlorothiazide (HYDRODIURIL) 12.5 MG tablet Take 1 tablet (12.5 mg total) by mouth daily.   meclizine (ANTIVERT) 25 MG tablet Take 1 tablet (25 mg total) by mouth 3 (three) times daily as needed for dizziness.   naproxen (NAPROSYN) 500 MG tablet TAKE 1 TABLET(500 MG) BY MOUTH TWICE DAILY WITH FOOD FOR UP TO 14 DAYS AS NEEDED   ondansetron (ZOFRAN) 8 MG tablet Take 1 tablet (8 mg total) by mouth every 8 (eight) hours as needed for nausea or vomiting.   pravastatin (PRAVACHOL) 40 MG tablet TAKE ONE TABLET BY MOUTH EVERY NIGHT AT BEDTIME   propranolol (INDERAL) 40 MG tablet TAKE 1 TABLET BY MOUTH 2 TIMES DAILY.   QUEtiapine (SEROQUEL) 25 MG tablet Take 1 tablet (25 mg total) by mouth at bedtime. Please schedule an office visit before anymore refills.   No facility-administered medications prior to visit.    Review of Systems     Objective    BP 125/80 (BP Location: Left Arm, Patient Position: Sitting, Cuff Size: Normal)   Pulse (!) 54   Temp 98.1 F (36.7 C) (Oral)   Resp  18   Wt 176 lb (79.8 kg)   SpO2 99% Comment: room air  BMI 31.18 kg/m  {Show previous vital signs (optional):23777}  Physical Exam   General: Appearance:    Mildly obese female in no acute distress  Eyes:    PERRL, conjunctiva/corneas clear, EOM's intact       Lungs:     Clear to auscultation bilaterally, respirations unlabored  Heart:    Bradycardic. Normal rhythm. No murmurs, rubs, or gallops.    MS:   All extremities are intact.    Neurologic:   Awake, alert, oriented x 3. No apparent focal neurological defect.         Results for orders placed or performed in visit on 10/31/20  POCT HgB A1C  Result Value Ref Range   Hemoglobin A1C 5.6 4.0 - 5.6 %   Est. average glucose Bld gHb Est-mCnc 114      Assessment & Plan     1. Borderline diabetes Doing well with diet and exercise.   2. Primary hypertension Well controlled. refill - propranolol (INDERAL) 40 MG tablet; Take 1 tablet (40 mg total) by mouth 2 (two) times daily.  Dispense: 180 tablet; Refill: 2  3. Need for shingles vaccine  - Administer Zoster, Recombinant (Shingrix) Vaccine #1  Future Appointments  Date Time Provider [place]  03/03/2021 11:00 AM Birdie Sons, MD [facility]    Flu vaccine was recommended but refused by patient      The entirety of the information documented in the History of Present Illness, Review of Systems and Physical Exam were personally obtained by me. Portions of this information were initially documented by the CMA and reviewed by me for thoroughness and accuracy.     Lelon Huh, MD  [facility] [phone] (phone) [phone] (fax)  [place]

## 2020-10-31 NOTE — Patient Instructions (Signed)
.   Please review the attached list of medications and notify my office if there are any errors.   . Please bring all of your medications to every appointment so we can make sure that our medication list is the same as yours.   

## 2021-01-18 ENCOUNTER — Ambulatory Visit (INDEPENDENT_AMBULATORY_CARE_PROVIDER_SITE_OTHER): Payer: Self-pay | Admitting: Family Medicine

## 2021-01-18 ENCOUNTER — Other Ambulatory Visit: Payer: Self-pay

## 2021-01-18 DIAGNOSIS — Z23 Encounter for immunization: Secondary | ICD-10-CM

## 2021-01-18 NOTE — Progress Notes (Signed)
Vaccine admin only, no E&M visit 

## 2021-03-03 ENCOUNTER — Ambulatory Visit: Payer: Self-pay | Admitting: Family Medicine

## 2021-03-22 ENCOUNTER — Other Ambulatory Visit: Payer: Self-pay | Admitting: Family Medicine

## 2021-06-07 ENCOUNTER — Encounter: Payer: Self-pay | Admitting: Family Medicine

## 2021-06-07 ENCOUNTER — Ambulatory Visit (INDEPENDENT_AMBULATORY_CARE_PROVIDER_SITE_OTHER): Payer: Self-pay | Admitting: Family Medicine

## 2021-06-07 VITALS — BP 139/82 | HR 60 | Temp 97.9°F | Resp 16 | Wt 195.5 lb

## 2021-06-07 DIAGNOSIS — R7303 Prediabetes: Secondary | ICD-10-CM

## 2021-06-07 DIAGNOSIS — I10 Essential (primary) hypertension: Secondary | ICD-10-CM

## 2021-06-07 DIAGNOSIS — G4700 Insomnia, unspecified: Secondary | ICD-10-CM

## 2021-06-07 DIAGNOSIS — E669 Obesity, unspecified: Secondary | ICD-10-CM

## 2021-06-07 DIAGNOSIS — Z87891 Personal history of nicotine dependence: Secondary | ICD-10-CM

## 2021-06-07 DIAGNOSIS — E7800 Pure hypercholesterolemia, unspecified: Secondary | ICD-10-CM

## 2021-06-07 DIAGNOSIS — E559 Vitamin D deficiency, unspecified: Secondary | ICD-10-CM

## 2021-06-07 MED ORDER — PHENTERMINE HCL 15 MG PO CAPS: 15.0000 mg | ORAL_CAPSULE | ORAL | 2 refills | Status: DC

## 2021-06-07 NOTE — Progress Notes (Signed)
?  ?I,Angela Woodward,acting as a scribe for Angela Huh, MD.,have documented all relevant documentation on the behalf of Angela Huh, MD,as directed by  Angela Huh, MD while in the presence of Angela Huh, MD. ? ? ? ?Established patient visit ? ? ?[name]   DOB: February 04, 1965   56 y.o. Female  MRN: [id number] ?Visit Date: 06/07/2021 ? ?Today's healthcare provider: Lelon Huh, MD  ? ?Chief Complaint  ?Patient presents with  ? Hypertension  ?  Pre-diabetes   ? ?Subjective  ?  ?Hypertension, follow-up ? ?BP Readings from Last 3 Encounters:  ?06/07/21 139/82  ?10/31/20 125/80  ?03/25/20 130/89  ? Wt Readings from Last 3 Encounters:  ?06/07/21 195 lb 8 oz (88.7 kg)  ?10/31/20 176 lb (79.8 kg)  ?03/25/20 178 lb (80.7 kg)  ?  ? ?She was last seen for hypertension 6 months ago.  ?BP at that visit was 125/80. Management since that visit includes Propranolol 40 mg 2 times daily. . ? ?She reports excellent compliance with treatment. ?She is not having side effects.  ?She is following a Regular diet. ?She is exercising. ?She does not smoke. ? ?Use of agents associated with hypertension: none.  ? ?Outside blood pressures are: occasional :  average 125/80 ?Symptoms: ?No chest pain No chest pressure  ?No palpitations No syncope  ?No dyspnea No orthopnea  ?No paroxysmal nocturnal dyspnea No lower extremity edema  ? ?Pertinent labs ?Lab Results  ?Component Value Date  ? CHOL 163 03/25/2020  ? HDL 49 03/25/2020  ? [place] 93 03/25/2020  ? TRIG 120 03/25/2020  ? CHOLHDL 3.3 03/25/2020  ? Lab Results  ?Component Value Date  ? NA 141 03/25/2020  ? K 4.7 03/25/2020  ? CREATININE 0.78 03/25/2020  ? EGFR 90 03/25/2020  ? GLUCOSE 97 03/25/2020  ? TSH 1.310 03/25/2020  ?  ? ?The 10-year ASCVD risk score (Arnett DK, et al., 2019) is: 7.9% ? ?--------------------------------------------------------------------------------------------------- ?She also requests medication to help with weight loss. She is tolerating  current medications for insomnia and high cholesterol and wishes to continue unchanged.  ? ?Medications: ?Outpatient Medications Prior to Visit  ?Medication Sig  ? aspirin EC 81 MG tablet Take 81 mg by mouth daily. Swallow whole.  ? hydrochlorothiazide (HYDRODIURIL) 12.5 MG tablet TAKE ONE TABLET BY MOUTH DAILY  ? meclizine (ANTIVERT) 25 MG tablet Take 1 tablet (25 mg total) by mouth 3 (three) times daily as needed for dizziness.  ? naproxen (NAPROSYN) 500 MG tablet TAKE 1 TABLET(500 MG) BY MOUTH TWICE DAILY WITH FOOD FOR UP TO 14 DAYS AS NEEDED  ? pravastatin (PRAVACHOL) 40 MG tablet TAKE ONE TABLET BY MOUTH EVERY NIGHT AT BEDTIME  ? propranolol (INDERAL) 40 MG tablet Take 1 tablet (40 mg total) by mouth 2 (two) times daily.  ? QUEtiapine (SEROQUEL) 25 MG tablet Take 1 tablet (25 mg total) by mouth at bedtime. Please schedule an office visit before anymore refills.  ? ?No facility-administered medications prior to visit.  ? ? ? ? ? ?  Objective  ?  ?BP 139/82 (BP Location: Right Arm, Patient Position: Sitting, Cuff Size: Normal)   Pulse 60   Temp 97.9 ?F (36.6 ?C) (Oral)   Resp 16   Wt 195 lb 8 oz (88.7 kg)   SpO2 99%   BMI 34.63 kg/m?  ? ? ?Physical Exam  ? ?General: Appearance:    Obese female in no acute distress  ?Eyes:    PERRL, conjunctiva/corneas clear, EOM's intact       ?  Lungs:     Clear to auscultation bilaterally, respirations unlabored  ?Heart:    Normal heart rate. Normal rhythm. No murmurs, rubs, or gallops.    ?MS:   All extremities are intact.    ?Neurologic:   Awake, alert, oriented x 3. No apparent focal neurological defect.   ?   ?  ? Assessment & Plan  ?  ? ?1. Avitaminosis D ? ?- VITAMIN D 25 Hydroxy (Vit-D Deficiency, Fractures) ? ?2. Borderline diabetes ?A1c is stable is going to work on losing weight.  ? ?3. Hypercholesterolemia without hypertriglyceridemia ?She is tolerating pravastatin well with no adverse effects.   ?- CBC ?- Lipid panel ?- Comprehensive metabolic panel ?- TSH ? ?4.  Primary hypertension ?Well controlled.  Continue current medications.   ? ?5. Insomnia, unspecified type ?Continue Seroquel prn.  ? ?6. Stopped smoking with greater than 20 pack year history ?Has stopped for about 11 years, but never had lung cancer screening.  ?- Ambulatory Referral Lung Cancer Screening [facility] ? ?7. Obesity (BMI 30.0-34.9) ?try- phentermine 15 MG capsule; Take 1 capsule (15 mg total) by mouth every morning.  Dispense: 30 capsule; Refill: 2 . Is trying to improve diet and exercising more.  ?   ? ?The entirety of the information documented in the History of Present Illness, Review of Systems and Physical Exam were personally obtained by me. Portions of this information were initially documented by the CMA and reviewed by me for thoroughness and accuracy.   ? ? ?Angela Huh, MD  ?[facility] ?[phone] (phone) ?[phone] (fax) ? ?[facility] ?

## 2021-06-07 NOTE — Patient Instructions (Signed)
Please review the attached list of medications and notify my office if there are any errors.   It is recommended to engage in 150 minutes of moderate exercise every week.   

## 2021-06-08 LAB — COMPREHENSIVE METABOLIC PANEL
ALT: 26 IU/L (ref 0–32)
AST: 22 IU/L (ref 0–40)
Albumin/Globulin Ratio: 1.9 (ref 1.2–2.2)
Albumin: 4.2 g/dL (ref 3.8–4.9)
Alkaline Phosphatase: 100 IU/L (ref 44–121)
BUN/Creatinine Ratio: 19 (ref 9–23)
BUN: 13 mg/dL (ref 6–24)
Bilirubin Total: 0.4 mg/dL (ref 0.0–1.2)
CO2: 25 mmol/L (ref 20–29)
Calcium: 9.7 mg/dL (ref 8.7–10.2)
Chloride: 101 mmol/L (ref 96–106)
Creatinine, Ser: 0.68 mg/dL (ref 0.57–1.00)
Globulin, Total: 2.2 g/dL (ref 1.5–4.5)
Glucose: 96 mg/dL (ref 70–99)
Potassium: 4.8 mmol/L (ref 3.5–5.2)
Sodium: 140 mmol/L (ref 134–144)
Total Protein: 6.4 g/dL (ref 6.0–8.5)
eGFR: 102 mL/min/{1.73_m2} (ref >59)

## 2021-06-08 LAB — LIPID PANEL
Chol/HDL Ratio: 3.9 ratio (ref 0.0–4.4)
Cholesterol, Total: 162 mg/dL (ref 100–199)
HDL: 42 mg/dL (ref >39)
LDL Chol Calc (NIH): 86 mg/dL (ref 0–99)
Triglycerides: 204 mg/dL — ABNORMAL HIGH (ref 0–149)
VLDL Cholesterol Cal: 34 mg/dL (ref 5–40)

## 2021-06-08 LAB — CBC
Hematocrit: 41.3 % (ref 34.0–46.6)
Hemoglobin: 13.7 g/dL (ref 11.1–15.9)
MCH: 29.4 pg (ref 26.6–33.0)
MCHC: 33.2 g/dL (ref 31.5–35.7)
MCV: 89 fL (ref 79–97)
Platelets: 295 10*3/uL (ref 150–450)
RBC: 4.66 x10E6/uL (ref 3.77–5.28)
RDW: 12.5 % (ref 11.7–15.4)
WBC: 6.1 10*3/uL (ref 3.4–10.8)

## 2021-06-08 LAB — VITAMIN D 25 HYDROXY (VIT D DEFICIENCY, FRACTURES): Vit D, 25-Hydroxy: 35.9 ng/mL (ref 30.0–100.0)

## 2021-06-08 LAB — TSH: TSH: 2.16 u[IU]/mL (ref 0.450–4.500)

## 2021-06-13 ENCOUNTER — Other Ambulatory Visit: Payer: Self-pay | Admitting: Family Medicine

## 2021-07-31 ENCOUNTER — Other Ambulatory Visit: Payer: Self-pay | Admitting: Family Medicine

## 2021-07-31 DIAGNOSIS — I10 Essential (primary) hypertension: Secondary | ICD-10-CM

## 2021-09-03 ENCOUNTER — Other Ambulatory Visit: Payer: Self-pay | Admitting: Family Medicine

## 2021-09-03 DIAGNOSIS — E669 Obesity, unspecified: Secondary | ICD-10-CM

## 2021-10-05 ENCOUNTER — Other Ambulatory Visit: Payer: Self-pay | Admitting: Family Medicine

## 2021-10-05 DIAGNOSIS — E669 Obesity, unspecified: Secondary | ICD-10-CM

## 2021-11-15 ENCOUNTER — Other Ambulatory Visit: Payer: Self-pay | Admitting: Family Medicine

## 2022-04-30 ENCOUNTER — Ambulatory Visit: Payer: Medicaid Other | Admitting: Family Medicine

## 2022-04-30 ENCOUNTER — Encounter: Payer: Medicaid Other | Admitting: Family Medicine

## 2022-04-30 DIAGNOSIS — E669 Obesity, unspecified: Secondary | ICD-10-CM

## 2022-04-30 DIAGNOSIS — R42 Dizziness and giddiness: Secondary | ICD-10-CM

## 2022-04-30 MED ORDER — PHENTERMINE HCL 30 MG PO CAPS: 30.0000 mg | ORAL_CAPSULE | Freq: Every morning | ORAL | 1 refills | Status: DC

## 2022-04-30 MED ORDER — MECLIZINE HCL 25 MG PO TABS: 25.0000 mg | ORAL_TABLET | Freq: Three times a day (TID) | ORAL | 0 refills | Status: AC | PRN

## 2022-04-30 NOTE — Patient Instructions (Signed)
.   Please review the attached list of medications and notify my office if there are any errors.   . Please bring all of your medications to every appointment so we can make sure that our medication list is the same as yours.   

## 2022-04-30 NOTE — Progress Notes (Signed)
Vivien Rota DeSanto,acting as a scribe for Mila Merry, MD.,have documented all relevant documentation on the behalf of Mila Merry, MD,as directed by  Mila Merry, MD while in the presence of Mila Merry, MD.     Established patient visit   [name]   DOB: Jan 04, 1966   57 y.o. Female  MRN: [id number] Visit Date: 04/30/2022  Today's healthcare provider: Mila Merry, MD    Subjective    HPI  Vertigo Patient is a 57 year old female who presents for evaluation of vertigo.  She states she had symptoms for 3.5 weeks but resolved a few days ago. She described the symptoms as the room spinning when she was laying down mostly but also sometimes when sitting if she turned her head from side to side.  She had similar episode several years ago and was prescribed meclizine, but had none left. She feels fine today.   She also due for follow up obesity on low dose phentermine which she is tolerating well.  Wt Readings from Last 5 Encounters:  04/30/22 193 lb (87.5 kg)  06/07/21 195 lb 8 oz (88.7 kg)  10/31/20 176 lb (79.8 kg)  03/25/20 178 lb (80.7 kg)  02/01/20 180 lb (81.6 kg)   She would like to increase to 30mg .   Medications: Outpatient Medications Prior to Visit  Medication Sig   aspirin EC 81 MG tablet Take 81 mg by mouth daily. Swallow whole.   hydrochlorothiazide (HYDRODIURIL) 12.5 MG tablet TAKE ONE TABLET BY MOUTH DAILY   phentermine 15 MG capsule TAKE ONE CAPSULE BY MOUTH EVERY MORNING   pravastatin (PRAVACHOL) 40 MG tablet TAKE ONE TABLET BY MOUTH EVERY NIGHT AT BEDTIME   propranolol (INDERAL) 40 MG tablet TAKE ONE TABLET BY MOUTH TWICE A DAY   QUEtiapine (SEROQUEL) 25 MG tablet TAKE ONE TABLET BY MOUTH EVERY NIGHT AT BEDTIME   [DISCONTINUED] meclizine (ANTIVERT) 25 MG tablet Take 1 tablet (25 mg total) by mouth 3 (three) times daily as needed for dizziness.   [DISCONTINUED] naproxen (NAPROSYN) 500 MG tablet TAKE 1 TABLET(500 MG) BY MOUTH TWICE DAILY  WITH FOOD FOR UP TO 14 DAYS AS NEEDED   No facility-administered medications prior to visit.    Review of Systems  Neurological:  Positive for dizziness. Negative for weakness, light-headedness, numbness and headaches.       Objective    BP 118/75 (BP Location: Left Arm, Patient Position: Sitting, Cuff Size: Normal)   Pulse 79   Temp 98.2 F (36.8 C) (Oral)   Wt 193 lb (87.5 kg)   SpO2 99%   BMI 34.19 kg/m    Physical Exam   General appearance: Mildly obese female, cooperative and in no acute distress Head: Normocephalic, without obvious abnormality, atraumatic Respiratory: Respirations even and unlabored, normal respiratory rate Extremities: All extremities are intact.  Skin: Skin color, texture, turgor normal. No rashes seen  Psych: Appropriate mood and affect. Neurologic: Mental status: Alert, oriented to person, place, and time, thought content appropriate. Negative Dix-Hallpike bilaterally.   Assessment & Plan     1. Vertigo Now resolved. Given instruction on Epley maneuver and prescription for meclizine for future reference.   2. Obesity (BMI 30.0-34.9) Increase from 15mg  to phentermine 30 MG capsule; Take 1 capsule (30 mg total) by mouth every morning.  Dispense: 30 capsule; Refill: 1      The entirety of the information documented in the History of Present Illness, Review of Systems and Physical Exam were personally obtained by  me. Portions of this information were initially documented by the CMA and reviewed by me for thoroughness and accuracy.     Lelon Huh, MD  [place] [phone] (phone) [phone] (fax)  [place]

## 2022-05-14 ENCOUNTER — Other Ambulatory Visit: Payer: Self-pay

## 2022-05-14 ENCOUNTER — Telehealth: Payer: Self-pay | Admitting: Family Medicine

## 2022-05-14 MED ORDER — QUETIAPINE FUMARATE 25 MG PO TABS: 25.0000 mg | ORAL_TABLET | Freq: Every day | ORAL | 3 refills | Status: DC

## 2022-05-14 NOTE — Telephone Encounter (Signed)
[facility] faxed refill request for the following medications:   QUEtiapine (SEROQUEL) 25 MG tablet    Please advise

## 2022-05-14 NOTE — Telephone Encounter (Signed)
LOV 04/30/22 NOV 05/30/22 LRF 11/15/21 90 x 1

## 2022-05-16 ENCOUNTER — Ambulatory Visit: Payer: Medicaid Other | Admitting: Family Medicine

## 2022-05-30 ENCOUNTER — Encounter: Payer: Medicaid Other | Admitting: Family Medicine

## 2022-06-11 ENCOUNTER — Other Ambulatory Visit: Payer: Self-pay | Admitting: Family Medicine

## 2022-06-12 NOTE — Telephone Encounter (Signed)
Requested medications are due for refill today.  yes  Requested medications are on the active medications list.  yes  Last refill. 03/22/2021 #90 4 rf  Future visit scheduled.   no  Notes to clinic.  Labs are expired.    Requested Prescriptions  Pending Prescriptions Disp Refills   hydrochlorothiazide (HYDRODIURIL) 12.5 MG tablet [Pharmacy Med Name: hydroCHLOROthiazide 12.5 MG TABLET] 90 tablet 4    Sig: TAKE ONE TABLET BY MOUTH DAILY     Cardiovascular: Diuretics - Thiazide Failed - 06/11/2022  6:22 AM      Failed - Cr in normal range and within 180 days    Creat  Date Value Ref Range Status  11/06/2016 0.80 0.50 - 1.05 mg/dL Final    Comment:    For patients >6 years of age, the reference limit for Creatinine is approximately 13% higher for people identified as African-American. .    Creatinine, Ser  Date Value Ref Range Status  06/07/2021 0.68 0.57 - 1.00 mg/dL Final         Failed - K in normal range and within 180 days    Potassium  Date Value Ref Range Status  06/07/2021 4.8 3.5 - 5.2 mmol/L Final         Failed - Na in normal range and within 180 days    Sodium  Date Value Ref Range Status  06/07/2021 140 134 - 144 mmol/L Final         Passed - Last BP in normal range    BP Readings from Last 1 Encounters:  04/30/22 118/75         Passed - Valid encounter within last 6 months    Recent Outpatient Visits           1 month ago Vertigo   [facility] [name], MD   1 year ago Avitaminosis D   [facility] [name], MD   1 year ago Need for shingles vaccine   [facility] [name], MD   1 year ago Borderline diabetes   [facility] [name], MD   2 years ago Primary hypertension   [facility] [name], MD

## 2022-06-19 ENCOUNTER — Other Ambulatory Visit: Payer: Self-pay | Admitting: Family Medicine

## 2022-07-24 ENCOUNTER — Telehealth: Payer: Self-pay

## 2022-07-24 NOTE — Telephone Encounter (Signed)
Chart review completed for patient. Patient is due for screening mammogram. Mychart message sent to patient to inquire about scheduling mammogram.  Anvitha Hutmacher, Population Health Specialist.  

## 2022-08-05 ENCOUNTER — Other Ambulatory Visit: Payer: Self-pay | Admitting: Family Medicine

## 2022-08-05 DIAGNOSIS — E669 Obesity, unspecified: Secondary | ICD-10-CM

## 2022-08-06 ENCOUNTER — Ambulatory Visit: Payer: Self-pay | Admitting: *Deleted

## 2022-08-06 NOTE — Telephone Encounter (Signed)
  Chief Complaint: coughing non stop Symptoms: Had URI 4 wks ago got better but now it's back with shortness of breath, sore ribs from coughing so much and having green mucus.  Scratchy throat Frequency: Started 4 wks ago got better then came back. Pertinent Negatives: Patient denies fever, nasal congestion. Disposition: [] ED /[] Urgent Care (no appt availability in office) / [x] Appointment(In office/virtual)/ []  [place] [facility]/ [] Home Care/ [] Refused Recommended Disposition /[] [place] [facility]/ []  Follow-up with PCP Additional Notes: Made her an appt for 08/07/2022 at 10:20 with Dr. Roxan Hockey.  She was at the walk in clinic when she called but there is a 2 hr wait so she was willing to wait until tomorrow to be seen at [facility].    "I don't feel like sitting here for 2 hrs and everyone around here is sick too".

## 2022-08-06 NOTE — Telephone Encounter (Signed)
Reason for Disposition  [1] Continuous (nonstop) coughing interferes with work or school AND [2] no improvement using cough treatment per Care Advice  Answer Assessment - Initial Assessment Questions 1. ONSET: "When did the cough begin?"      I'm coughing up green mucus.     I'm coughing a lot.   I did have a sore throat 3-4 weeks ago and fever 101 but it broke.    I thought I got rid of the cough and now it's back.     2. SEVERITY: "How bad is the cough today?"      Bad 3. SPUTUM: "Describe the color of your sputum" (none, dry cough; clear, white, yellow, green)     Green mucus 4. HEMOPTYSIS: "Are you coughing up any blood?" If so ask: "How much?" (flecks, streaks, tablespoons, etc.)     Not asked 5. DIFFICULTY BREATHING: "Are you having difficulty breathing?" If Yes, ask: "How bad is it?" (e.g., mild, moderate, severe)    - MILD: No SOB at rest, mild SOB with walking, speaks normally in sentences, can lie down, no retractions, pulse < 100.    - MODERATE: SOB at rest, SOB with minimal exertion and prefers to sit, cannot lie down flat, speaks in phrases, mild retractions, audible wheezing, pulse 100-120.    - SEVERE: Very SOB at rest, speaks in single words, struggling to breathe, sitting hunched forward, retractions, pulse > 120      A deep breath makes me cough.   My ribs are so sore from coughing. 6. FEVER: "Do you have a fever?" If Yes, ask: "What is your temperature, how was it measured, and when did it start?"     No 7. CARDIAC HISTORY: "Do you have any history of heart disease?" (e.g., heart attack, congestive heart failure)      Not asked 8. LUNG HISTORY: "Do you have any history of lung disease?"  (e.g., pulmonary embolus, asthma, emphysema)     No 9. PE RISK FACTORS: "Do you have a history of blood clots?" (or: recent major surgery, recent prolonged travel, bedridden)     Not asked 10. OTHER SYMPTOMS: "Do you have any other symptoms?" (e.g., runny nose, wheezing, chest pain)        Scratchy throat 11. PREGNANCY: "Is there any chance you are pregnant?" "When was your last menstrual period?"       Not asked 12. TRAVEL: "Have you traveled out of the country in the last month?" (e.g., travel history, exposures)       My o2 saturation is 88-93%.    My normal is 96%  Protocols used: Cough - Acute Productive-A-AH

## 2022-08-07 ENCOUNTER — Encounter: Payer: Self-pay | Admitting: Family Medicine

## 2022-08-07 ENCOUNTER — Ambulatory Visit: Payer: Medicaid Other | Admitting: Family Medicine

## 2022-08-07 VITALS — BP 133/84 | HR 67 | Temp 97.6°F | Resp 20 | Ht 63.0 in | Wt 192.8 lb

## 2022-08-07 DIAGNOSIS — J209 Acute bronchitis, unspecified: Secondary | ICD-10-CM

## 2022-08-07 MED ORDER — PREDNISONE 10 MG PO TABS
ORAL_TABLET | ORAL | 0 refills | Status: AC
Start: 2022-08-07 — End: 2022-08-17

## 2022-08-07 MED ORDER — BENZONATATE 100 MG PO CAPS: 100.0000 mg | ORAL_CAPSULE | Freq: Two times a day (BID) | ORAL | 0 refills | Status: DC | PRN

## 2022-08-07 NOTE — Patient Instructions (Signed)
VISIT SUMMARY:  During your visit, we discussed your persistent cough and sputum production, which are symptoms of acute bronchitis. We also noted the presence of fluid behind your eardrums, but no treatment is needed for this at the moment. Additionally, we confirmed that you do not need a Pap smear following your partial hysterectomy, as long as your cervix has been removed and there is no history of vaginal cancer.  YOUR PLAN:  -ACUTE BRONCHITIS: Acute bronchitis is an infection of the bronchial tubes in your lungs, which causes a cough that can last for several weeks. To help manage your symptoms, we will start you on a 10-day course of Prednisone, a medication that reduces inflammation in your lungs. You will also take Tessalon Perles as needed to help control your cough. Continue to drink at least 3 liters of water per day to stay hydrated. If your symptoms worsen or you develop wheezing or shortness of breath, please return for further care. If your oxygen levels consistently stay below 90%, seek emergency care.  -BILATERAL TYMPANIC MEMBRANE EFFUSION: This condition means there is fluid behind your eardrums, but there is no infection or inflammation present. At this time, no specific treatment is needed.  -GYNECOLOGICAL HEALTH MAINTENANCE: Since your cervix has been removed during your partial hysterectomy and you have no history of vaginal cancer, you do not need to have a Pap smear.  INSTRUCTIONS:  Start taking Prednisone and Tessalon Perles as prescribed. Continue to drink at least 3 liters of water per day. If your symptoms worsen or you develop wheezing or shortness of breath, please return for further care. If your oxygen levels consistently stay below 90%, seek emergency care.

## 2022-08-07 NOTE — Assessment & Plan Note (Signed)
Persistent cough with sputum production, worse at night. No wheezing or crackles on exam. No fever. History of recent upper respiratory infection. -Acute, stable VS  -Start Prednisone 10mg , tapering dose over 10 days (40mg  for 2 days, 30mg  for 2 days, 20mg  for 2 days, 10mg  for 2 days, 5mg  for 2 days). -Start Tessalon Perles (Benzonatate) 100mg  BID as needed for cough. -Continue hydration with 3 liters of water per day. -Return to care for worsening symptoms or development of wheezing or shortness of breath. -Seek emergency care if oxygen saturation persistently below 90%.

## 2022-08-07 NOTE — Progress Notes (Signed)
Established patient visit   [name]   DOB: Jun 17, 1965   57 y.o. Female  MRN: [id number] Visit Date: 08/07/2022  Today's healthcare provider: Ronnald Ramp, MD   Chief Complaint  Patient presents with   URI    She reports taking NyQuil, and cough syrup, she reports not symptom control. She reports cough is worse at night. She reports she was sick about 4 weeks ago with cold and laryngitis.   Subjective     HPI     URI   Associated symptoms inlclude achiness, cough, shortness of breath and wheezing.  Recent episode started in the past 7 days.  The problem has been unchanged since onset.  The temperature has been with in normal range.  Patient  is drinking plenty of fluids.  Past hisotry is significant for  no history of pneumonia or bronchitis.  Patient is not a smoker. Additional comments: She reports taking NyQuil, and cough syrup, she reports not symptom control. She reports cough is worse at night. She reports she was sick about 4 weeks ago with cold and laryngitis.      Last edited by Ronnald Ramp, MD on 08/07/2022 10:45 AM.      Discussed the use of AI scribe software for clinical note transcription with the patient, who gave verbal consent to proceed.  History of Present Illness   The patient, with a recent history of a respiratory illness, presents with a persistent cough that has not improved with over-the-counter cough suppressants. The cough is described as worse at night and is associated with a dry mouth and difficulty swallowing in the mornings. The patient also reports a dark yellow to greenish sputum production.  Approximately a month ago, the patient experienced a similar illness, characterized by a sore throat and chest discomfort, which resulted in voice loss. The illness resolved after about a week. However, about five to six days ago, the patient began experiencing similar symptoms again, primarily localized to the  chest.  The patient denies fever with the current illness, in contrast to the previous episode. She reports sweating and some discomfort in the chest and back, which they attribute to the persistent coughing. The patient also mentions variable oxygen saturation levels, ranging from 85 to 92, as measured at home.  The patient has been attempting to manage the symptoms with Nyquil, cough syrup, and throat lozenges, as well as maintaining hydration. Despite these efforts, the cough persists and is significantly impacting the patient's quality of life.       Medications: Outpatient Medications Prior to Visit  Medication Sig   aspirin EC 81 MG tablet Take 81 mg by mouth daily. Swallow whole.   hydrochlorothiazide (HYDRODIURIL) 12.5 MG tablet TAKE ONE TABLET BY MOUTH DAILY   meclizine (ANTIVERT) 25 MG tablet Take 1 tablet (25 mg total) by mouth 3 (three) times daily as needed for dizziness.   phentermine 30 MG capsule TAKE 1 CAPSULE BY MOUTH EVERY MORNING   pravastatin (PRAVACHOL) 40 MG tablet Take 1 tablet (40 mg total) by mouth at bedtime. Please schedule office visit before any future refill.   propranolol (INDERAL) 40 MG tablet TAKE ONE TABLET BY MOUTH TWICE A DAY   QUEtiapine (SEROQUEL) 25 MG tablet Take 1 tablet (25 mg total) by mouth at bedtime.   No facility-administered medications prior to visit.    Review of Systems       Objective    BP 133/84 (BP Location: Left Arm, Patient  Position: Sitting, Cuff Size: Large)   Pulse 67   Temp 97.6 F (36.4 C) (Temporal)   Resp 20   Ht 5\' 3"  (1.6 m)   Wt 192 lb 12.8 oz (87.5 kg)   SpO2 100%   BMI 34.15 kg/m      Physical Exam  Physical Exam   VITALS: Respiratory rate 20, SaO2 100%. HEENT: Bilateral tympanic membranes bulging without erythema or drainage, oral mucosa dry. no purulent effusion  CHEST: Lung sounds clear, no wheezing, no crackles, good air movement. frequent coughing throughout exam, normal respiratory effort   CARDIOVASCULAR: Heart sounds normal with RRR without murmur, normal S1 and S2       No results found for any visits on 08/07/22.  Assessment & Plan     Problem List Items Addressed This Visit     Acute bronchitis - Primary    Persistent cough with sputum production, worse at night. No wheezing or crackles on exam. No fever. History of recent upper respiratory infection. -Acute, stable VS  -Start Prednisone 10mg , tapering dose over 10 days (40mg  for 2 days, 30mg  for 2 days, 20mg  for 2 days, 10mg  for 2 days, 5mg  for 2 days). -Start Tessalon Perles (Benzonatate) 100mg  BID as needed for cough. -Continue hydration with 3 liters of water per day. -Return to care for worsening symptoms or development of wheezing or shortness of breath. -Seek emergency care if oxygen saturation persistently below 90%.      Relevant Medications   predniSONE (DELTASONE) 10 MG tablet   benzonatate (TESSALON) 100 MG capsule     Gynecological Health Maintenance: Patient inquired about need for Pap smear following partial hysterectomy. -Confirmed that Pap smear is not necessary if cervix has been removed and there is no history of vaginal cancer.        No follow-ups on file.         Ronnald Ramp, MD  [facility] [phone] (phone) [phone] (fax)  [facility]

## 2022-09-20 ENCOUNTER — Other Ambulatory Visit: Payer: Self-pay | Admitting: Family Medicine

## 2022-10-17 ENCOUNTER — Other Ambulatory Visit: Payer: Self-pay | Admitting: Family Medicine

## 2022-10-24 ENCOUNTER — Other Ambulatory Visit: Payer: Self-pay | Admitting: Family Medicine

## 2022-10-24 DIAGNOSIS — I10 Essential (primary) hypertension: Secondary | ICD-10-CM

## 2022-10-24 NOTE — Telephone Encounter (Signed)
Requested Prescriptions  Pending Prescriptions Disp Refills   propranolol (INDERAL) 40 MG tablet [Pharmacy Med Name: PROPRANOLOL 40 MG TABLET] 180 tablet 0    Sig: TAKE 1 TABLET BY MOUTH TWICE A DAY     Cardiovascular:  Beta Blockers Passed - 10/24/2022  6:22 AM      Passed - Last BP in normal range    BP Readings from Last 1 Encounters:  08/07/22 133/84         Passed - Last Heart Rate in normal range    Pulse Readings from Last 1 Encounters:  08/07/22 67         Passed - Valid encounter within last 6 months    Recent Outpatient Visits           2 months ago Acute bronchitis, unspecified organism   [facility] Simmons-Robinson, Ruth, MD   5 months ago Vertigo   [facility] [name], MD   1 year ago Avitaminosis D   [facility] [name], MD   1 year ago Need for shingles vaccine   [facility] [name], MD   1 year ago Borderline diabetes   [facility] [name], MD       Future Appointments             In 5 days Fisher, Demetrios Isaacs, MD [facility]

## 2022-10-29 ENCOUNTER — Encounter: Payer: Self-pay | Admitting: Family Medicine

## 2022-10-29 ENCOUNTER — Other Ambulatory Visit: Payer: Self-pay

## 2022-10-29 ENCOUNTER — Telehealth: Payer: Self-pay

## 2022-10-29 ENCOUNTER — Ambulatory Visit: Payer: Medicaid Other | Admitting: Family Medicine

## 2022-10-29 VITALS — BP 116/70 | HR 70 | Ht 63.0 in | Wt 189.8 lb

## 2022-10-29 DIAGNOSIS — G4700 Insomnia, unspecified: Secondary | ICD-10-CM

## 2022-10-29 DIAGNOSIS — I10 Essential (primary) hypertension: Secondary | ICD-10-CM

## 2022-10-29 DIAGNOSIS — E559 Vitamin D deficiency, unspecified: Secondary | ICD-10-CM | POA: Diagnosis not present

## 2022-10-29 DIAGNOSIS — Z800 Family history of malignant neoplasm of digestive organs: Secondary | ICD-10-CM

## 2022-10-29 DIAGNOSIS — Z1231 Encounter for screening mammogram for malignant neoplasm of breast: Secondary | ICD-10-CM

## 2022-10-29 DIAGNOSIS — R7303 Prediabetes: Secondary | ICD-10-CM

## 2022-10-29 DIAGNOSIS — Z1211 Encounter for screening for malignant neoplasm of colon: Secondary | ICD-10-CM

## 2022-10-29 DIAGNOSIS — Z860100 Personal history of colon polyps, unspecified: Secondary | ICD-10-CM

## 2022-10-29 DIAGNOSIS — K50119 Crohn's disease of large intestine with unspecified complications: Secondary | ICD-10-CM | POA: Diagnosis not present

## 2022-10-29 DIAGNOSIS — E7800 Pure hypercholesterolemia, unspecified: Secondary | ICD-10-CM

## 2022-10-29 MED ORDER — NA SULFATE-K SULFATE-MG SULF 17.5-3.13-1.6 GM/177ML PO SOLN
1.0000 | Freq: Once | ORAL | 0 refills | Status: DC
Start: 2022-10-29 — End: 2023-04-19

## 2022-10-29 NOTE — Telephone Encounter (Signed)
Gastroenterology Pre-Procedure Review  Request Date: 12/04/22 Requesting Physician: Dr. Tobi Bastos  PATIENT REVIEW QUESTIONS: The patient responded to the following health history questions as indicated:    1. Are you having any GI issues? no 2. Do you have a personal history of Polyps? yes (last colonoscopy performed by Dr. Tobi Bastos 10/31/2017) 3. Do you have a family history of Colon Cancer or Polyps? yes (brother died of stage 4 colon cancer) 4. Diabetes Mellitus? no 5. Joint replacements in the past 12 months?no 6. Major health problems in the past 3 months?no 7. Any artificial heart valves, MVP, or defibrillator?no    MEDICATIONS & ALLERGIES:    Patient reports the following regarding taking any anticoagulation/antiplatelet therapy:   Plavix, Coumadin, Eliquis, Xarelto, Lovenox, Pradaxa, Brilinta, or Effient? no Aspirin? yes (81 mg daily)  Patient confirms/reports the following medications:  Current Outpatient Medications  Medication Sig Dispense Refill   aspirin EC 81 MG tablet Take 81 mg by mouth daily. Swallow whole.     benzonatate (TESSALON) 100 MG capsule Take 1 capsule (100 mg total) by mouth 2 (two) times daily as needed for cough. 20 capsule 0   hydrochlorothiazide (HYDRODIURIL) 12.5 MG tablet TAKE ONE TABLET BY MOUTH DAILY 90 tablet 4   meclizine (ANTIVERT) 25 MG tablet Take 1 tablet (25 mg total) by mouth 3 (three) times daily as needed for dizziness. 20 tablet 0   phentermine 30 MG capsule TAKE 1 CAPSULE BY MOUTH EVERY MORNING 30 capsule 2   pravastatin (PRAVACHOL) 40 MG tablet TAKE 1 TABLET BY MOUTH EVERY NIGHT AT BEDTIME *OFFICE VISIT NEEDED FOR FUTURE REFILL* 30 tablet 0   propranolol (INDERAL) 40 MG tablet TAKE 1 TABLET BY MOUTH TWICE A DAY 180 tablet 0   QUEtiapine (SEROQUEL) 25 MG tablet Take 1 tablet (25 mg total) by mouth at bedtime. 90 tablet 3   No current facility-administered medications for this visit.    Patient confirms/reports the following allergies:   Allergies  Allergen Reactions   Maxalt  [Rizatriptan Benzoate] Swelling    Throat swelling.   Cephalosporins    Codeine    Erythromycin    Ketorolac Tromethamine    Lisinopril Cough   Penicillins    Simvastatin Swelling    Other reaction(s): Muscle Pain   Sulfa Antibiotics    Tetanus Toxoid     Other reaction(s): Swelling    No orders of the defined types were placed in this encounter.   AUTHORIZATION INFORMATION Primary Insurance: 1D#: Group #:  Secondary Insurance: 1D#: Group #:  SCHEDULE INFORMATION: Date: 12/04/22 Time: Location: [facility]

## 2022-10-29 NOTE — Progress Notes (Signed)
Established patient visit   [name]   DOB: July 08, 1965   57 y.o. Female  MRN: [id number] Visit Date: 10/29/2022  Today's healthcare provider: Mila Merry, MD   Chief Complaint  Patient presents with   Medication Refill    All medications    Subjective    Discussed the use of AI scribe software for clinical note transcription with the patient, who gave verbal consent to proceed.  History of Present Illness   The patient, with a history of hypertension and hyperlipidemia, presents for a routine follow-up visit. She has been monitoring her blood pressure at home, which has been consistently within the range of 120-126 mmHg.  The patient has also been taking Phentermine for weight loss, but reports minimal effect. Despite this, she has noticed some weight loss and muscle gain, which she attributes to regular exercise, approximately five to six times a week for 30 minutes to an hour each session.  She denies any symptoms of shortness of breath or chest pain.  The patient also reports having eaten a donut and a cup of coffee prior to the visit. She has not reported any new medications or changes in her current medication regimen. She denies any swelling in her hands or feet.  Her last colonoscopy was performed five years ago and she is supposed to have every 5 years due to family history. Her last mammogram was over two years ago. She has not reported any issues or concerns regarding these upcoming procedures.       Medications: Outpatient Medications Prior to Visit  Medication Sig   aspirin EC 81 MG tablet Take 81 mg by mouth daily. Swallow whole.   benzonatate (TESSALON) 100 MG capsule Take 1 capsule (100 mg total) by mouth 2 (two) times daily as needed for cough.   hydrochlorothiazide (HYDRODIURIL) 12.5 MG tablet TAKE ONE TABLET BY MOUTH DAILY   meclizine (ANTIVERT) 25 MG tablet Take 1 tablet (25 mg total) by mouth 3 (three) times daily as needed for  dizziness.   phentermine 30 MG capsule TAKE 1 CAPSULE BY MOUTH EVERY MORNING   pravastatin (PRAVACHOL) 40 MG tablet TAKE 1 TABLET BY MOUTH EVERY NIGHT AT BEDTIME *OFFICE VISIT NEEDED FOR FUTURE REFILL*   propranolol (INDERAL) 40 MG tablet TAKE 1 TABLET BY MOUTH TWICE A DAY   QUEtiapine (SEROQUEL) 25 MG tablet Take 1 tablet (25 mg total) by mouth at bedtime.   No facility-administered medications prior to visit.   Review of Systems  Constitutional:  Negative for appetite change, chills, fatigue and fever.  Respiratory:  Negative for chest tightness and shortness of breath.   Cardiovascular:  Negative for chest pain and palpitations.  Gastrointestinal:  Negative for abdominal pain, nausea and vomiting.  Neurological:  Negative for dizziness and weakness.       Objective    BP 116/70 (BP Location: Left Arm, Patient Position: Sitting, Cuff Size: Large)   Pulse 70   Ht 5\' 3"  (1.6 m)   Wt 189 lb 12.8 oz (86.1 kg)   SpO2 99%   BMI 33.62 kg/m   Physical Exam   General: Appearance:    Mildly obese female in no acute distress  Eyes:    PERRL, conjunctiva/corneas clear, EOM's intact       Lungs:     Clear to auscultation bilaterally, respirations unlabored  Heart:    Normal heart rate. Normal rhythm. No murmurs, rubs, or gallops.    MS:   All  extremities are intact.    Neurologic:   Awake, alert, oriented x 3. No apparent focal neurological defect.         Assessment & Plan    1. Primary hypertension Well controlled with changes in diet and exercise, doing well current medications.   2. Borderline diabetes  - Hemoglobin A1c  3. Hypercholesterolemia without hypertriglyceridemia She is tolerating pravastatin well with no adverse effects.   - CBC - Comprehensive metabolic panel - Lipid panel - TSH  4. Avitaminosis D  - VITAMIN D 25 Hydroxy (Vit-D Deficiency, Fractures)  5. Insomnia, unspecified type Doing well hs Seroquel.   6. Breast cancer screening by mammogram  -  MM 3D Screening Breast Bilateral - [facility]; Future  7. Segmental colitis with complication (HCC)  8. Colon cancer screening Per GI, colonoscopy recommended Q 5 years.  - Ambulatory referral to gastroenterology for colonoscopy     Mila Merry, MD  [facility] [phone] (phone) [phone] (fax)  [facility]

## 2022-10-29 NOTE — Patient Instructions (Addendum)
Please review the attached list of medications and notify my office if there are any errors.   Please bring all of your medications to every appointment so we can make sure that our medication list is the same as yours.   Please call the [facility] [phone]) to schedule a routine screening mammogram.

## 2022-10-30 LAB — COMPREHENSIVE METABOLIC PANEL
ALT: 45 [IU]/L — ABNORMAL HIGH (ref 0–32)
AST: 34 [IU]/L (ref 0–40)
Albumin: 4.3 g/dL (ref 3.8–4.9)
Alkaline Phosphatase: 132 [IU]/L — ABNORMAL HIGH (ref 44–121)
BUN/Creatinine Ratio: 21 (ref 9–23)
BUN: 15 mg/dL (ref 6–24)
Bilirubin Total: 0.5 mg/dL (ref 0.0–1.2)
CO2: 25 mmol/L (ref 20–29)
Calcium: 9.5 mg/dL (ref 8.7–10.2)
Chloride: 101 mmol/L (ref 96–106)
Creatinine, Ser: 0.73 mg/dL (ref 0.57–1.00)
Globulin, Total: 2.5 g/dL (ref 1.5–4.5)
Glucose: 155 mg/dL — ABNORMAL HIGH (ref 70–99)
Potassium: 5 mmol/L (ref 3.5–5.2)
Sodium: 140 mmol/L (ref 134–144)
Total Protein: 6.8 g/dL (ref 6.0–8.5)
eGFR: 95 mL/min/{1.73_m2} (ref >59)

## 2022-10-30 LAB — CBC
Hematocrit: 44.9 % (ref 34.0–46.6)
Hemoglobin: 14.6 g/dL (ref 11.1–15.9)
MCH: 29.3 pg (ref 26.6–33.0)
MCHC: 32.5 g/dL (ref 31.5–35.7)
MCV: 90 fL (ref 79–97)
Platelets: 347 10*3/uL (ref 150–450)
RBC: 4.99 x10E6/uL (ref 3.77–5.28)
RDW: 12.9 % (ref 11.7–15.4)
WBC: 7.3 10*3/uL (ref 3.4–10.8)

## 2022-10-30 LAB — LIPID PANEL
Chol/HDL Ratio: 3.8 {ratio} (ref 0.0–4.4)
Cholesterol, Total: 160 mg/dL (ref 100–199)
HDL: 42 mg/dL (ref >39)
LDL Chol Calc (NIH): 77 mg/dL (ref 0–99)
Triglycerides: 252 mg/dL — ABNORMAL HIGH (ref 0–149)
VLDL Cholesterol Cal: 41 mg/dL — ABNORMAL HIGH (ref 5–40)

## 2022-10-30 LAB — VITAMIN D 25 HYDROXY (VIT D DEFICIENCY, FRACTURES): Vit D, 25-Hydroxy: 37.5 ng/mL (ref 30.0–100.0)

## 2022-10-30 LAB — TSH: TSH: 1.84 u[IU]/mL (ref 0.450–4.500)

## 2022-10-30 LAB — HEMOGLOBIN A1C
Est. average glucose Bld gHb Est-mCnc: 126 mg/dL
Hgb A1c MFr Bld: 6 % — ABNORMAL HIGH (ref 4.8–5.6)

## 2022-11-09 ENCOUNTER — Other Ambulatory Visit: Payer: Self-pay | Admitting: Family Medicine

## 2022-11-09 MED ORDER — PRAVASTATIN SODIUM 40 MG PO TABS: 40.0000 mg | ORAL_TABLET | Freq: Every day | ORAL | 4 refills | Status: DC

## 2022-12-03 ENCOUNTER — Encounter: Payer: Self-pay | Admitting: Gastroenterology

## 2022-12-04 ENCOUNTER — Ambulatory Visit: Payer: Medicaid Other | Admitting: Anesthesiology

## 2022-12-04 ENCOUNTER — Ambulatory Visit
Admission: RE | Admit: 2022-12-04 | Discharge: 2022-12-04 | Disposition: A | Discharge status: Home / Self Care | Payer: Medicaid Other | Attending: Gastroenterology | Admitting: Gastroenterology

## 2022-12-04 ENCOUNTER — Encounter: Payer: Self-pay | Admitting: Gastroenterology

## 2022-12-04 ENCOUNTER — Encounter
Admission: RE | Disposition: A | Discharge status: Home / Self Care | Payer: Self-pay | Source: Home / Self Care | Attending: Gastroenterology

## 2022-12-04 DIAGNOSIS — Z860100 Personal history of colon polyps, unspecified: Secondary | ICD-10-CM | POA: Insufficient documentation

## 2022-12-04 DIAGNOSIS — Z1211 Encounter for screening for malignant neoplasm of colon: Secondary | ICD-10-CM | POA: Diagnosis not present

## 2022-12-04 DIAGNOSIS — K5730 Diverticulosis of large intestine without perforation or abscess without bleeding: Secondary | ICD-10-CM | POA: Insufficient documentation

## 2022-12-04 DIAGNOSIS — Z800 Family history of malignant neoplasm of digestive organs: Secondary | ICD-10-CM | POA: Diagnosis not present

## 2022-12-04 DIAGNOSIS — K640 First degree hemorrhoids: Secondary | ICD-10-CM | POA: Diagnosis not present

## 2022-12-04 DIAGNOSIS — K649 Unspecified hemorrhoids: Secondary | ICD-10-CM | POA: Diagnosis not present

## 2022-12-04 HISTORY — PX: COLONOSCOPY WITH PROPOFOL: SHX5780

## 2022-12-04 SURGERY — COLONOSCOPY WITH PROPOFOL
Anesthesia: General

## 2022-12-04 MED ORDER — PROPOFOL 500 MG/50ML IV EMUL
INTRAVENOUS | Status: DC | PRN
  Administered 2022-12-04: 100 ug/kg/min via INTRAVENOUS

## 2022-12-04 MED ORDER — LIDOCAINE HCL (CARDIAC) PF 100 MG/5ML IV SOSY
PREFILLED_SYRINGE | INTRAVENOUS | Status: DC | PRN
  Administered 2022-12-04: 80 mg via INTRAVENOUS

## 2022-12-04 MED ORDER — SODIUM CHLORIDE 0.9 % IV SOLN: INTRAVENOUS | Status: DC

## 2022-12-04 NOTE — Anesthesia Postprocedure Evaluation (Signed)
Anesthesia Post Note  [name]  Procedure(s) Performed: COLONOSCOPY WITH PROPOFOL  Patient location during evaluation: Endoscopy Anesthesia Type: General Level of consciousness: awake and alert Pain management: pain level controlled Vital Signs Assessment: post-procedure vital signs reviewed and stable Respiratory status: spontaneous breathing, nonlabored ventilation, respiratory function stable and patient connected to nasal cannula oxygen Cardiovascular status: blood pressure returned to baseline and stable Postop Assessment: no apparent nausea or vomiting Anesthetic complications: no   No notable events documented.   Last Vitals:  Vitals:   12/04/22 1015 12/04/22 1024  BP: 132/87 137/83  Pulse: 69 (!) 59  Resp: 19 13  Temp:    SpO2: 99% 100%    Last Pain:  Vitals:   12/04/22 1024  TempSrc:   PainSc: 0-No pain                 Louie Boston

## 2022-12-04 NOTE — Transfer of Care (Signed)
Immediate Anesthesia Transfer of Care Note  [name]  Procedure(s) Performed: COLONOSCOPY WITH PROPOFOL  Patient Location: PACU  Anesthesia Type:General  Level of Consciousness: sedated  Airway & Oxygen Therapy: Patient Spontanous Breathing  Post-op Assessment: Report given to RN and Post -op Vital signs reviewed and stable  Post vital signs: Reviewed and stable  Last Vitals: See PACUflow sheet.    Vitals Value Taken Time  BP    Temp    Pulse    Resp    SpO2      Last Pain:  Vitals:   12/04/22 0917  TempSrc: Temporal         Complications: No notable events documented.

## 2022-12-04 NOTE — Op Note (Signed)
[facility] Gastroenterology Patient Name: Angela Woodward Procedure Date: 12/04/2022 9:32 AM MRN: [id number] Account #: [id number] Date of Birth: May 14, 1965 Admit Type: Outpatient Age: 57 Room: [facility] ENDO ROOM 2 Gender: Female Note Status: Finalized Instrument Name: Nelda Marseille [id number] Procedure:             Colonoscopy Indications:           Screening in patient at increased risk: Family history                         of 1st-degree relative with colorectal cancer Providers:             Wyline Mood MD, MD Referring MD:          Demetrios Isaacs. Sherrie Mustache, MD (Referring MD) Medicines:             Monitored Anesthesia Care Complications:         No immediate complications. Procedure:             Pre-Anesthesia Assessment:                        - Prior to the procedure, a History and Physical was                         performed, and patient medications, allergies and                         sensitivities were reviewed. The patient's tolerance                         of previous anesthesia was reviewed.                        - The risks and benefits of the procedure and the                         sedation options and risks were discussed with the                         patient. All questions were answered and informed                         consent was obtained.                        - ASA Grade Assessment: II - A patient with mild                         systemic disease.                        After obtaining informed consent, the colonoscope was                         passed under direct vision. Throughout the procedure,                         the patient's blood pressure, pulse, and oxygen  saturations were monitored continuously. The                         Colonoscope was introduced through the anus and                         advanced to the the cecum, identified by the                         appendiceal orifice. The colonoscopy was  performed                         with ease. The patient tolerated the procedure well.                         The quality of the bowel preparation was excellent.                         The ileocecal valve, appendiceal orifice, and rectum                         were photographed. Findings:      Multiple medium-mouthed diverticula were found in the left colon.      Non-bleeding internal hemorrhoids were found during retroflexion. The       hemorrhoids were medium-sized and Grade I (internal hemorrhoids that do       not prolapse).      The exam was otherwise without abnormality on direct and retroflexion       views. Impression:            - Diverticulosis in the left colon.                        - Non-bleeding internal hemorrhoids.                        - The examination was otherwise normal on direct and                         retroflexion views.                        - No specimens collected. Recommendation:        - Discharge patient to home (with escort).                        - Resume previous diet.                        - Continue present medications.                        - Repeat colonoscopy in 5 years for screening purposes. Procedure Code(s):     --- Professional ---                        [phone], Colonoscopy, flexible; diagnostic, including                         collection of specimen(s) by brushing or washing, when  performed (separate procedure) Diagnosis Code(s):     --- Professional ---                        Z80.0, Family history of malignant neoplasm of                         digestive organs                        K64.0, First degree hemorrhoids                        K57.30, Diverticulosis of large intestine without                         perforation or abscess without bleeding CPT copyright 2022 American Medical Association. All rights reserved. The codes documented in this report are preliminary and upon coder review may  be  revised to meet current compliance requirements. Wyline Mood, MD Wyline Mood MD, MD 12/04/2022 9:54:03 AM This report has been signed electronically. Number of Addenda: 0 Note Initiated On: 12/04/2022 9:32 AM Scope Withdrawal Time: 0 hours 7 minutes 0 seconds  Total Procedure Duration: 0 hours 8 minutes 16 seconds  Estimated Blood Loss:  Estimated blood loss: none.      [facility]

## 2022-12-04 NOTE — Anesthesia Preprocedure Evaluation (Signed)
Anesthesia Evaluation  Patient identified by MRN, date of birth, ID band Patient awake    Reviewed: Allergy & Precautions, NPO status , Patient's Chart, lab work & pertinent test results  History of Anesthesia Complications Negative for: history of anesthetic complications  Airway Mallampati: III  TM Distance: >3 FB Neck ROM: full    Dental no notable dental hx.    Pulmonary neg pulmonary ROS, former smoker   Pulmonary exam normal        Cardiovascular hypertension, On Medications negative cardio ROS Normal cardiovascular exam     Neuro/Psych negative neurological ROS  negative psych ROS   GI/Hepatic negative GI ROS, Neg liver ROS,,,  Endo/Other  negative endocrine ROS    Renal/GU negative Renal ROS  negative genitourinary   Musculoskeletal   Abdominal   Peds  Hematology negative hematology ROS (+)   Anesthesia Other Findings Past Medical History: No date: Hypertension 11/03/2015: Segmental colitis with rectal bleeding (HCC)  Past Surgical History: 10/31/2017: COLONOSCOPY WITH PROPOFOL; N/A     Comment:  Procedure: COLONOSCOPY WITH PROPOFOL;  Surgeon: Wyline Mood, MD;  Location: [facility] ENDOSCOPY;  Service:               Gastroenterology;  Laterality: N/A; 03/28/12: FRACTURE SURGERY     Comment:  wrist 4 pins, 10 screws  08/2014: KNEE ARTHROSCOPY; Left     Comment:  Dr. Thomasena Edis; [facility] 03/07/1998: VAGINAL HYSTERECTOMY  BMI    Body Mass Index: 32.56 kg/m      Reproductive/Obstetrics negative OB ROS                             Anesthesia Physical Anesthesia Plan  ASA: 2  Anesthesia Plan: General   Post-op Pain Management: Minimal or no pain anticipated   Induction: Intravenous  PONV Risk Score and Plan: 2 and Propofol infusion and TIVA  Airway Management Planned: Natural Airway and Nasal Cannula  Additional Equipment:   Intra-op Plan:    Post-operative Plan:   Informed Consent: I have reviewed the patients History and Physical, chart, labs and discussed the procedure including the risks, benefits and alternatives for the proposed anesthesia with the patient or authorized representative who has indicated his/her understanding and acceptance.     Dental Advisory Given  Plan Discussed with: Anesthesiologist, CRNA and Surgeon  Anesthesia Plan Comments: (Patient consented for risks of anesthesia including but not limited to:  - adverse reactions to medications - risk of airway placement if required - damage to eyes, teeth, lips or other oral mucosa - nerve damage due to positioning  - sore throat or hoarseness - Damage to heart, brain, nerves, lungs, other parts of body or loss of life  Patient voiced understanding and assent.)       Anesthesia Quick Evaluation

## 2022-12-04 NOTE — H&P (Signed)
Wyline Mood, MD [address], [place], [place], [address] [address], [address], [place], [place], [address] Phone: [phone]  Fax: [phone]  Primary Care Physician:  [name], MD   Pre-Procedure History & Physical: HPI:  Angela Woodward is a 57 y.o. female is here for an colonoscopy.   Past Medical History:  Diagnosis Date   Segmental colitis with rectal bleeding (HCC) 11/03/2015    Past Surgical History:  Procedure Laterality Date   COLONOSCOPY WITH PROPOFOL N/A 10/31/2017   Procedure: COLONOSCOPY WITH PROPOFOL;  Surgeon: Wyline Mood, MD;  Location: [facility] ENDOSCOPY;  Service: Gastroenterology;  Laterality: N/A;   FRACTURE SURGERY  03/28/12   wrist 4 pins, 10 screws    KNEE ARTHROSCOPY Left 08/2014   Dr. Thomasena Edis; [facility]   VAGINAL HYSTERECTOMY  03/07/1998    Prior to Admission medications   Medication Sig Start Date End Date Taking? Authorizing Provider  aspirin EC 81 MG tablet Take 81 mg by mouth daily. Swallow whole.   Yes [provider]  hydrochlorothiazide (HYDRODIURIL) 12.5 MG tablet TAKE ONE TABLET BY MOUTH DAILY 06/12/22  Yes [name], MD  pravastatin (PRAVACHOL) 40 MG tablet Take 1 tablet (40 mg total) by mouth daily. 11/09/22  Yes [name], MD  propranolol (INDERAL) 40 MG tablet TAKE 1 TABLET BY MOUTH TWICE A DAY 10/24/22  Yes [name], MD  QUEtiapine (SEROQUEL) 25 MG tablet Take 1 tablet (25 mg total) by mouth at bedtime. 05/14/22  Yes [name], MD  benzonatate (TESSALON) 100 MG capsule Take 1 capsule (100 mg total) by mouth 2 (two) times daily as needed for cough. 08/07/22   Simmons-Robinson, Tawanna Cooler, MD  meclizine (ANTIVERT) 25 MG tablet Take 1 tablet (25 mg total) by mouth 3 (three) times daily as needed for dizziness. 04/30/22   [name], MD  phentermine 30 MG capsule TAKE 1 CAPSULE BY MOUTH EVERY MORNING 08/06/22   [name], MD    Allergies as of 10/29/2022 - Review  Complete 10/29/2022  Allergen Reaction Noted   Maxalt  [rizatriptan benzoate] Swelling 01/14/2015   Cephalosporins  01/14/2015   Codeine  01/14/2015   Erythromycin  01/14/2015   Ketorolac tromethamine  01/14/2015   Lisinopril Cough 03/25/2020   Penicillins  01/14/2015   Simvastatin Swelling 01/14/2015   Sulfa antibiotics  01/14/2015   Tetanus toxoid  01/14/2015    Family History  Problem Relation Age of Onset   Cancer Father    Lung cancer Father    Hypertension Father    Mental illness Father    Obesity Brother    Hypertension Brother    Colon cancer Brother    Diabetes Brother    Hypertension Brother    Obesity Brother    Stroke Brother Early 30's       paralyses of left side of body   Heart attack Brother 56   Leukemia Mother    Rheum arthritis Mother    AAA (abdominal aortic aneurysm) Brother    Breast cancer Neg Hx     Social History   Socioeconomic History   Marital status: Widowed    Spouse name: Not on file   Number of children: 2   Years of education: Not on file   Highest education level: Not on file  Occupational History   Occupation: Full Time  Tobacco Use   Smoking status: Former    Current packs/day: 0.00    Types: Cigarettes  Quit date: 09/09/2009    Years since quitting: 13.2   Smokeless tobacco: Never   Tobacco comments:    1ppd for 25 years  Vaping Use   Vaping status: Never Used  Substance and Sexual Activity   Alcohol use: No   Drug use: No   Sexual activity: Not on file  Other Topics Concern   Not on file  Social History Narrative   Not on file   Social Determinants of Health   Financial Resource Strain: Not on file  Food Insecurity: Not on file  Transportation Needs: Not on file  Physical Activity: Not on file  Stress: Not on file  Social Connections: Not on file  Intimate Partner Violence: Not on file    Review of Systems: See HPI, otherwise negative ROS  Physical Exam: There were no vitals taken for this  visit. General:   Alert,  pleasant and cooperative in NAD Head:  Normocephalic and atraumatic. Neck:  Supple; no masses or thyromegaly. Lungs:  Clear throughout to auscultation, normal respiratory effort.    Heart:  +S1, +S2, Regular rate and rhythm, No edema. Abdomen:  Soft, nontender and nondistended. Normal bowel sounds, without guarding, and without rebound.   Neurologic:  Alert and  oriented x4;  grossly normal neurologically.  Impression/Plan: Karenlee Fenton Luellen is here for an colonoscopy to be performed for surveillance due to prior history of colon polyps   Risks, benefits, limitations, and alternatives regarding  colonoscopy have been reviewed with the patient.  Questions have been answered.  All parties agreeable.   Wyline Mood, MD  12/04/2022, 9:16 AM

## 2022-12-05 ENCOUNTER — Encounter: Payer: Self-pay | Admitting: Gastroenterology

## 2022-12-05 ENCOUNTER — Other Ambulatory Visit: Payer: Self-pay | Admitting: Family Medicine

## 2022-12-05 DIAGNOSIS — E66811 Obesity, class 1: Secondary | ICD-10-CM

## 2022-12-06 ENCOUNTER — Telehealth: Payer: Self-pay | Admitting: Family Medicine

## 2022-12-06 NOTE — Telephone Encounter (Signed)
[employer] is asking for [place] to rewrite Pravastatin  40 mg. For 90 tabs instead of 30.  Please send new RX to [facility]

## 2022-12-07 NOTE — Telephone Encounter (Signed)
11/09/22 #[id number]

## 2023-01-20 ENCOUNTER — Other Ambulatory Visit: Payer: Self-pay | Admitting: Family Medicine

## 2023-01-20 DIAGNOSIS — I10 Essential (primary) hypertension: Secondary | ICD-10-CM

## 2023-02-25 ENCOUNTER — Other Ambulatory Visit: Payer: Self-pay | Admitting: Family Medicine

## 2023-02-25 DIAGNOSIS — Z1231 Encounter for screening mammogram for malignant neoplasm of breast: Secondary | ICD-10-CM

## 2023-02-28 ENCOUNTER — Ambulatory Visit
Admission: RE | Admit: 2023-02-28 | Discharge: 2023-02-28 | Disposition: A | Discharge status: Home / Self Care | Payer: Medicaid Other | Source: Ambulatory Visit | Attending: Family Medicine | Admitting: Family Medicine

## 2023-02-28 DIAGNOSIS — Z1231 Encounter for screening mammogram for malignant neoplasm of breast: Secondary | ICD-10-CM | POA: Insufficient documentation

## 2023-04-15 ENCOUNTER — Other Ambulatory Visit: Payer: Self-pay | Admitting: Gastroenterology

## 2023-04-18 ENCOUNTER — Other Ambulatory Visit: Payer: Self-pay | Admitting: Family Medicine

## 2023-04-18 DIAGNOSIS — I10 Essential (primary) hypertension: Secondary | ICD-10-CM

## 2023-04-19 ENCOUNTER — Other Ambulatory Visit: Payer: Self-pay | Admitting: Family Medicine

## 2023-04-19 DIAGNOSIS — E66811 Obesity, class 1: Secondary | ICD-10-CM

## 2023-04-22 ENCOUNTER — Telehealth: Payer: Self-pay

## 2023-04-22 ENCOUNTER — Other Ambulatory Visit (HOSPITAL_COMMUNITY): Payer: Self-pay

## 2023-04-22 NOTE — Telephone Encounter (Signed)
 Pharmacy Patient Advocate Encounter   Received notification from Onbase that prior authorization for QUEtiapine Fumarate 25MG  tablets is required/requested.   Insurance verification completed.   The patient is insured through [facility] .   Per test claim: PA required; PA submitted to above mentioned insurance via CoverMyMeds Key/confirmation #/EOC [facility] Status is pending

## 2023-04-22 NOTE — Telephone Encounter (Signed)
 Pharmacy Patient Advocate Encounter  Received notification from [facility] that Prior Authorization for QUEtiapine Fumarate 25MG  tablets has been APPROVED from 04/22/23 to 04/21/24. Unable to obtain price due to refill too soon rejection, last fill date 04/22/23 next available fill date06/07/25   PA #/Case ID/Reference #: [id number]

## 2023-04-23 ENCOUNTER — Telehealth: Payer: Self-pay | Admitting: *Deleted

## 2023-04-23 NOTE — Telephone Encounter (Signed)
 Called and left VM for patient to call regarding lung cancer screening.

## 2023-07-14 ENCOUNTER — Other Ambulatory Visit: Payer: Self-pay | Admitting: Family Medicine

## 2024-01-08 ENCOUNTER — Other Ambulatory Visit: Payer: Self-pay | Admitting: Family Medicine

## 2024-02-02 ENCOUNTER — Other Ambulatory Visit: Payer: Self-pay | Admitting: Family Medicine

## 2024-02-28 ENCOUNTER — Other Ambulatory Visit: Payer: Self-pay | Admitting: Family Medicine
